# Patient Record
Sex: Female | Born: 1995 | Race: Black or African American | Hispanic: No | Marital: Single | Smoking: Former smoker
Health system: Southern US, Community
[De-identification: ages and names within clinical notes are randomized; demographics above are authoritative.]

## PROBLEM LIST (undated history)

## (undated) ENCOUNTER — Ambulatory Visit: Payer: Commercial Managed Care - PPO | Source: Home / Self Care

## (undated) DIAGNOSIS — F419 Anxiety disorder, unspecified: Secondary | ICD-10-CM

## (undated) DIAGNOSIS — H209 Unspecified iridocyclitis: Secondary | ICD-10-CM

## (undated) DIAGNOSIS — J45909 Unspecified asthma, uncomplicated: Secondary | ICD-10-CM

## (undated) DIAGNOSIS — B009 Herpesviral infection, unspecified: Secondary | ICD-10-CM

## (undated) DIAGNOSIS — F32A Depression, unspecified: Secondary | ICD-10-CM

## (undated) DIAGNOSIS — F329 Major depressive disorder, single episode, unspecified: Secondary | ICD-10-CM

## (undated) DIAGNOSIS — M0800 Unspecified juvenile rheumatoid arthritis of unspecified site: Secondary | ICD-10-CM

## (undated) HISTORY — PX: WISDOM TOOTH EXTRACTION: SHX21

## (undated) HISTORY — DX: Unspecified juvenile rheumatoid arthritis of unspecified site: M08.00

## (undated) HISTORY — DX: Herpesviral infection, unspecified: B00.9

---
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
## 2017-01-07 ENCOUNTER — Emergency Department (HOSPITAL_COMMUNITY)
Admission: EM | Admit: 2017-01-07 | Discharge: 2017-01-08 | Disposition: A | Discharge status: Other Acute Inpatient Hospital | Payer: BLUE CROSS/BLUE SHIELD | Attending: Emergency Medicine | Admitting: Emergency Medicine

## 2017-01-07 ENCOUNTER — Encounter (HOSPITAL_COMMUNITY): Payer: Self-pay | Admitting: Family Medicine

## 2017-01-07 DIAGNOSIS — F22 Delusional disorders: Secondary | ICD-10-CM

## 2017-01-07 DIAGNOSIS — R638 Other symptoms and signs concerning food and fluid intake: Secondary | ICD-10-CM | POA: Insufficient documentation

## 2017-01-07 DIAGNOSIS — Z046 Encounter for general psychiatric examination, requested by authority: Secondary | ICD-10-CM | POA: Diagnosis not present

## 2017-01-07 DIAGNOSIS — F29 Unspecified psychosis not due to a substance or known physiological condition: Secondary | ICD-10-CM | POA: Diagnosis not present

## 2017-01-07 DIAGNOSIS — F333 Major depressive disorder, recurrent, severe with psychotic symptoms: Secondary | ICD-10-CM | POA: Diagnosis present

## 2017-01-07 DIAGNOSIS — R440 Auditory hallucinations: Secondary | ICD-10-CM | POA: Diagnosis not present

## 2017-01-07 DIAGNOSIS — R460 Very low level of personal hygiene: Secondary | ICD-10-CM | POA: Diagnosis not present

## 2017-01-07 DIAGNOSIS — Z79899 Other long term (current) drug therapy: Secondary | ICD-10-CM | POA: Diagnosis not present

## 2017-01-07 DIAGNOSIS — Z87891 Personal history of nicotine dependence: Secondary | ICD-10-CM | POA: Diagnosis not present

## 2017-01-07 DIAGNOSIS — R45851 Suicidal ideations: Secondary | ICD-10-CM | POA: Diagnosis not present

## 2017-01-07 DIAGNOSIS — F1220 Cannabis dependence, uncomplicated: Secondary | ICD-10-CM | POA: Diagnosis not present

## 2017-01-07 DIAGNOSIS — F329 Major depressive disorder, single episode, unspecified: Secondary | ICD-10-CM | POA: Diagnosis present

## 2017-01-07 DIAGNOSIS — F332 Major depressive disorder, recurrent severe without psychotic features: Secondary | ICD-10-CM | POA: Diagnosis present

## 2017-01-07 HISTORY — DX: Depression, unspecified: F32.A

## 2017-01-07 HISTORY — DX: Anxiety disorder, unspecified: F41.9

## 2017-01-07 HISTORY — DX: Major depressive disorder, single episode, unspecified: F32.9

## 2017-01-07 LAB — CBC
HEMATOCRIT: 35.7 % — AB (ref 36.0–46.0)
Hemoglobin: 11.8 g/dL — ABNORMAL LOW (ref 12.0–15.0)
MCH: 27.6 pg (ref 26.0–34.0)
MCHC: 33.1 g/dL (ref 30.0–36.0)
MCV: 83.6 fL (ref 78.0–100.0)
Platelets: 236 10*3/uL (ref 150–400)
RBC: 4.27 MIL/uL (ref 3.87–5.11)
RDW: 13.3 % (ref 11.5–15.5)
WBC: 5.7 10*3/uL (ref 4.0–10.5)

## 2017-01-07 LAB — RAPID URINE DRUG SCREEN, HOSP PERFORMED
AMPHETAMINES: NOT DETECTED
BENZODIAZEPINES: NOT DETECTED
Barbiturates: NOT DETECTED
Cocaine: NOT DETECTED
Opiates: NOT DETECTED
Tetrahydrocannabinol: POSITIVE — AB

## 2017-01-07 LAB — COMPREHENSIVE METABOLIC PANEL
ALT: 22 U/L (ref 14–54)
AST: 23 U/L (ref 15–41)
Albumin: 4.2 g/dL (ref 3.5–5.0)
Alkaline Phosphatase: 49 U/L (ref 38–126)
Anion gap: 7 (ref 5–15)
BUN: 11 mg/dL (ref 6–20)
CO2: 24 mmol/L (ref 22–32)
CREATININE: 0.67 mg/dL (ref 0.44–1.00)
Calcium: 9 mg/dL (ref 8.9–10.3)
Chloride: 107 mmol/L (ref 101–111)
GFR calc non Af Amer: >60 mL/min (ref >60)
Glucose, Bld: 103 mg/dL — ABNORMAL HIGH (ref 65–99)
POTASSIUM: 3 mmol/L — AB (ref 3.5–5.1)
SODIUM: 138 mmol/L (ref 135–145)
Total Bilirubin: 0.6 mg/dL (ref 0.3–1.2)
Total Protein: 7.1 g/dL (ref 6.5–8.1)

## 2017-01-07 LAB — ACETAMINOPHEN LEVEL

## 2017-01-07 LAB — POC URINE PREG, ED: PREG TEST UR: NEGATIVE

## 2017-01-07 LAB — ETHANOL: Alcohol, Ethyl (B): <10 mg/dL (ref <10)

## 2017-01-07 LAB — SALICYLATE LEVEL

## 2017-01-07 MED ORDER — POTASSIUM CHLORIDE CRYS ER 20 MEQ PO TBCR
60.0000 meq | EXTENDED_RELEASE_TABLET | Freq: Once | ORAL | Status: AC
  Administered 2017-01-07: 60 meq via ORAL
  Filled 2017-01-07: qty 3

## 2017-01-07 NOTE — ED Notes (Addendum)
Patient didn't take all her Potassium, only about half of it. She reports the pills were too big despite breaking them in half. Will notify the PA.
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
## 2017-01-07 NOTE — ED Triage Notes (Signed)
Patient was picked up from home and brought in by [facility] as an IVC. Patient denies being suicidal or homicidal. However, patient does admit to being paranoid, "about everything". IVC papers report patient is not eating, tending to her personal hygiene, made a remark to father about ending up dead, and provides examples about being paranoid. Also reports she has been smoking weed, which patient does admit to smoking 1-2 times a day.

## 2017-01-07 NOTE — ED Notes (Signed)
IVC papers have been faxed to Floydene Flock counsler at [facility]. TTS consult being performed.
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
## 2017-01-08 ENCOUNTER — Other Ambulatory Visit: Payer: Self-pay

## 2017-01-08 ENCOUNTER — Inpatient Hospital Stay (HOSPITAL_COMMUNITY)
Admission: AD | Admit: 2017-01-08 | Discharge: 2017-01-12 | DRG: 885 | Disposition: A | Discharge status: Home / Self Care | Payer: BLUE CROSS/BLUE SHIELD | Source: Intra-hospital | Attending: Psychiatry | Admitting: Psychiatry

## 2017-01-08 ENCOUNTER — Encounter (HOSPITAL_COMMUNITY): Payer: Self-pay | Admitting: *Deleted

## 2017-01-08 DIAGNOSIS — Z62810 Personal history of physical and sexual abuse in childhood: Secondary | ICD-10-CM | POA: Diagnosis not present

## 2017-01-08 DIAGNOSIS — Z9119 Patient's noncompliance with other medical treatment and regimen: Secondary | ICD-10-CM | POA: Diagnosis not present

## 2017-01-08 DIAGNOSIS — M069 Rheumatoid arthritis, unspecified: Secondary | ICD-10-CM | POA: Diagnosis present

## 2017-01-08 DIAGNOSIS — Z888 Allergy status to other drugs, medicaments and biological substances status: Secondary | ICD-10-CM | POA: Diagnosis not present

## 2017-01-08 DIAGNOSIS — F39 Unspecified mood [affective] disorder: Secondary | ICD-10-CM | POA: Diagnosis not present

## 2017-01-08 DIAGNOSIS — Z23 Encounter for immunization: Secondary | ICD-10-CM

## 2017-01-08 DIAGNOSIS — F333 Major depressive disorder, recurrent, severe with psychotic symptoms: Secondary | ICD-10-CM

## 2017-01-08 DIAGNOSIS — Z91013 Allergy to seafood: Secondary | ICD-10-CM

## 2017-01-08 DIAGNOSIS — R450 Nervousness: Secondary | ICD-10-CM | POA: Diagnosis not present

## 2017-01-08 DIAGNOSIS — F29 Unspecified psychosis not due to a substance or known physiological condition: Secondary | ICD-10-CM

## 2017-01-08 DIAGNOSIS — Z87891 Personal history of nicotine dependence: Secondary | ICD-10-CM

## 2017-01-08 DIAGNOSIS — X58XXXA Exposure to other specified factors, initial encounter: Secondary | ICD-10-CM | POA: Diagnosis present

## 2017-01-08 DIAGNOSIS — F329 Major depressive disorder, single episode, unspecified: Secondary | ICD-10-CM | POA: Diagnosis present

## 2017-01-08 DIAGNOSIS — F4310 Post-traumatic stress disorder, unspecified: Secondary | ICD-10-CM | POA: Diagnosis not present

## 2017-01-08 DIAGNOSIS — Z91018 Allergy to other foods: Secondary | ICD-10-CM

## 2017-01-08 DIAGNOSIS — G4700 Insomnia, unspecified: Secondary | ICD-10-CM | POA: Diagnosis present

## 2017-01-08 DIAGNOSIS — F1220 Cannabis dependence, uncomplicated: Secondary | ICD-10-CM

## 2017-01-08 DIAGNOSIS — T43216A Underdosing of selective serotonin and norepinephrine reuptake inhibitors, initial encounter: Secondary | ICD-10-CM | POA: Diagnosis present

## 2017-01-08 DIAGNOSIS — Z91128 Patient's intentional underdosing of medication regimen for other reason: Secondary | ICD-10-CM | POA: Diagnosis not present

## 2017-01-08 DIAGNOSIS — Z79899 Other long term (current) drug therapy: Secondary | ICD-10-CM | POA: Diagnosis not present

## 2017-01-08 DIAGNOSIS — F419 Anxiety disorder, unspecified: Secondary | ICD-10-CM | POA: Diagnosis present

## 2017-01-08 DIAGNOSIS — R443 Hallucinations, unspecified: Secondary | ICD-10-CM | POA: Diagnosis not present

## 2017-01-08 DIAGNOSIS — F332 Major depressive disorder, recurrent severe without psychotic features: Secondary | ICD-10-CM | POA: Diagnosis present

## 2017-01-08 MED ORDER — CITALOPRAM HYDROBROMIDE 10 MG PO TABS
10.0000 mg | ORAL_TABLET | Freq: Every day | ORAL | Status: DC
  Filled 2017-01-08: qty 1

## 2017-01-08 MED ORDER — ALUM & MAG HYDROXIDE-SIMETH 200-200-20 MG/5ML PO SUSP: 30.0000 mL | ORAL | Status: DC | PRN

## 2017-01-08 MED ORDER — ACETAMINOPHEN 325 MG PO TABS: 650.0000 mg | ORAL_TABLET | Freq: Four times a day (QID) | ORAL | Status: DC | PRN

## 2017-01-08 MED ORDER — INFLUENZA VAC SPLIT QUAD 0.5 ML IM SUSY
0.5000 mL | PREFILLED_SYRINGE | INTRAMUSCULAR | Status: AC
  Administered 2017-01-09: 0.5 mL via INTRAMUSCULAR
  Filled 2017-01-08: qty 0.5

## 2017-01-08 MED ORDER — MAGNESIUM HYDROXIDE 400 MG/5ML PO SUSP: 30.0000 mL | Freq: Every day | ORAL | Status: DC | PRN

## 2017-01-08 MED ORDER — ONDANSETRON HCL 4 MG PO TABS: 4.0000 mg | ORAL_TABLET | Freq: Three times a day (TID) | ORAL | Status: DC | PRN

## 2017-01-08 MED ORDER — ACETAMINOPHEN 325 MG PO TABS: 650.0000 mg | ORAL_TABLET | ORAL | Status: DC | PRN

## 2017-01-08 MED ORDER — HALOPERIDOL 1 MG PO TABS
0.5000 mg | ORAL_TABLET | Freq: Two times a day (BID) | ORAL | Status: DC
  Filled 2017-01-08: qty 1

## 2017-01-08 MED ORDER — ONDANSETRON HCL 4 MG PO TABS
4.0000 mg | ORAL_TABLET | Freq: Three times a day (TID) | ORAL | Status: DC | PRN
Start: 2017-01-08 — End: 2017-01-12

## 2017-01-08 MED ORDER — HALOPERIDOL 0.5 MG PO TABS
0.5000 mg | ORAL_TABLET | Freq: Two times a day (BID) | ORAL | Status: DC
  Administered 2017-01-09: 0.5 mg via ORAL
  Filled 2017-01-08 (×7): qty 1

## 2017-01-08 MED ORDER — POTASSIUM CHLORIDE 20 MEQ/15ML (10%) PO SOLN
30.0000 meq | Freq: Once | ORAL | Status: AC
  Administered 2017-01-08: 30 meq via ORAL
  Filled 2017-01-08: qty 30

## 2017-01-08 MED ORDER — HYDROXYZINE HCL 25 MG PO TABS
25.0000 mg | ORAL_TABLET | Freq: Two times a day (BID) | ORAL | Status: DC
  Administered 2017-01-09: 25 mg via ORAL
  Filled 2017-01-08 (×7): qty 1

## 2017-01-08 MED ORDER — HYDROXYZINE HCL 25 MG PO TABS
25.0000 mg | ORAL_TABLET | Freq: Two times a day (BID) | ORAL | Status: DC
  Filled 2017-01-08: qty 1

## 2017-01-08 MED ORDER — CITALOPRAM HYDROBROMIDE 10 MG PO TABS
10.0000 mg | ORAL_TABLET | Freq: Every day | ORAL | Status: DC
  Administered 2017-01-09 – 2017-01-12 (×4): 10 mg via ORAL
  Filled 2017-01-08 (×4): qty 1
  Filled 2017-01-08: qty 7
  Filled 2017-01-08 (×4): qty 1

## 2017-01-08 NOTE — Progress Notes (Signed)
Kayla Ochoa is a 21 year old female pt admitted on involuntary basis. On admission, she endorses depression but denies any SI and is able to contract for safety while in the hospital. She did appear watchful and suspicious and did report feeling paranoid. She spoke about how she was on medications but has not taken them in a few months and reports that she does not want to take any while she is here. She does endorse frequent marijuana usage and reports that she usually smokes it 2 times a day and reports that she used to smoke it up to 8 times daily. She denies any other substance abuse. She reports that she lives with her mother and reports that she will be able to go back there after discharge. She was oriented to the unit and safety maintained.

## 2017-01-08 NOTE — ED Notes (Signed)
Pt A&O x 3, no distress noted, pt sleeping at present.  Monitoring for safety, Q 15 min checks in effect.

## 2017-01-08 NOTE — ED Notes (Signed)
Report attempted x 1, unit states RN Brook to return call.

## 2017-01-08 NOTE — ED Notes (Signed)
Report to RN [place], [facility].  Pending [employer] transport.

## 2017-01-08 NOTE — ED Notes (Signed)
[employer] transport requested. 

## 2017-01-08 NOTE — Tx Team (Signed)
Initial Treatment Plan 01/08/2017 10:11 PM Kayla Ochoa PYP:[id number]    PATIENT STRESSORS: Marital or family conflict Medication change or noncompliance Substance abuse   PATIENT STRENGTHS: Ability for insight Average or above average intelligence Communication skills General fund of knowledge   PATIENT IDENTIFIED PROBLEMS: Depression Paranoia Suicidal thoughts "I just want to get my life straight" "Family and friends are stressors and dealing with it all"                     DISCHARGE CRITERIA:  Ability to meet basic life and health needs Improved stabilization in mood, thinking, and/or behavior Verbal commitment to aftercare and medication compliance  PRELIMINARY DISCHARGE PLAN: Attend aftercare/continuing care group Return to previous living arrangement  PATIENT/FAMILY INVOLVEMENT: This treatment plan has been presented to and reviewed with the patient, Kayla Ochoa, and/or family member, .  The patient and family have been given the opportunity to ask questions and make suggestions.  Darron Stuck, [place], [place] 01/08/2017, 10:11 PM

## 2017-01-08 NOTE — ED Notes (Signed)
Patients care transferred to [place], RN in [place] for room 34. Patient was wanded by security and she ambulated to [facility] 34 with a steady gait. Patients belongings and IVC papers sent to SAPU.

## 2017-01-08 NOTE — BH Assessment (Addendum)
[facility] Assessment Progress Note  Per Thedore Mins, MD, this pt requires psychiatric hospitalization.  Malva Limes, RN, [facility] has assigned pt to [facility] Rm [id number]; they will be ready to receive pt at 16:00.  Pt presents under IVC initiated by pt's mother, and upheld by Dr Jannifer Franklin, and IVC documents have been faxed to [facility].  Pt's nurse, Kendal Hymen, has been notified, and agrees to call report to [phone].  Pt is to be transported via [employer].   Doylene Canning, MA Triage Specialist [phone]   Addendum:  Richelle Ito calls back.  Transfer time has been moved back to 19:30.  Kendal Hymen has been notified.  Doylene Canning, MA Triage Specialist [phone]

## 2017-01-08 NOTE — Progress Notes (Signed)
Pt places as do not admit due to paranoia/watchfulness/suspiciousness. Pt sts, "I know they are coming for me. I don't know who, but they are." Will continue to monitor.

## 2017-01-08 NOTE — Progress Notes (Signed)
01/08/17 1409:  LRT introduced self to pt and offered activities.  Pt stated she was interested because it would give her something to do.  LRT and pt played checkers in the dayroom. Pt was pleasant and engaged throughout activity.  LRT asked pt if she wanted any word searches for later, pt stated she did.  LRT copied word searches for pt so she would have them later.   Caroll Rancher, LRT/CTRS

## 2017-01-08 NOTE — ED Notes (Signed)
Pt is a bit calmer and even interacted with other patient and recreation therapy earlier.  Patient is in no distress and resting quietly.
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
## 2017-01-08 NOTE — ED Notes (Signed)
Pt is very irritable after finding out that she will not be released.  Pt is denying S/I, H/I, and AVH.  Pt is refusing her medications.  15 minute checks and video monitoring continue.

## 2017-01-08 NOTE — ED Notes (Signed)
Pt presents with paranoia, denies SI, HI or AVH.  A&O x 3. Calm & cooperative, no distress noted.  Monitoring for safety, Q 15 min checks in effect.

## 2017-01-09 DIAGNOSIS — F419 Anxiety disorder, unspecified: Secondary | ICD-10-CM

## 2017-01-09 DIAGNOSIS — F333 Major depressive disorder, recurrent, severe with psychotic symptoms: Principal | ICD-10-CM

## 2017-01-09 DIAGNOSIS — F1220 Cannabis dependence, uncomplicated: Secondary | ICD-10-CM

## 2017-01-09 DIAGNOSIS — R443 Hallucinations, unspecified: Secondary | ICD-10-CM

## 2017-01-09 DIAGNOSIS — Z62810 Personal history of physical and sexual abuse in childhood: Secondary | ICD-10-CM

## 2017-01-09 DIAGNOSIS — F4310 Post-traumatic stress disorder, unspecified: Secondary | ICD-10-CM

## 2017-01-09 DIAGNOSIS — R450 Nervousness: Secondary | ICD-10-CM

## 2017-01-09 MED ORDER — RISPERIDONE 0.5 MG PO TABS
0.5000 mg | ORAL_TABLET | Freq: Two times a day (BID) | ORAL | Status: DC
  Administered 2017-01-09 – 2017-01-10 (×3): 0.5 mg via ORAL
  Filled 2017-01-09 (×6): qty 1

## 2017-01-09 MED ORDER — HYDROXYZINE HCL 25 MG PO TABS
25.0000 mg | ORAL_TABLET | Freq: Four times a day (QID) | ORAL | Status: DC | PRN
  Administered 2017-01-10 – 2017-01-11 (×2): 25 mg via ORAL
  Filled 2017-01-09: qty 12
  Filled 2017-01-09 (×2): qty 1
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
## 2017-01-09 NOTE — [facility] Suicide Risk Assessment (Signed)
[facility] INPATIENT:  Family/Significant Other Suicide Prevention Education  Suicide Prevention Education:  Patient Refusal for Family/Significant Other Suicide Prevention Education: The patient Kayla Ochoa has refused to provide written consent for family/significant other to be provided Family/Significant Other Suicide Prevention Education during admission and/or prior to discharge.  Physician notified.  Metro Kung Buffey Zabinski 01/09/2017, 9:26 AM

## 2017-01-09 NOTE — [facility] Group Notes (Signed)
[facility] LCSW Group Therapy  01/09/2017  1:05 PM  Type of Therapy:  Group therapy  Participation Level:  Active  Participation Quality:  Attentive  Affect:  Flat  Cognitive:  Oriented  Insight:  Limited  Engagement in Therapy:  Limited  Modes of Intervention:  Discussion, Socialization  Summary of Progress/Problems:  Chaplain was here to lead a group on themes of hope and courage. "hope is family.  My mom is my role model.  She is the most important person in my life." Later cited her Letta Kocher as an influential person.  "He has a lot of wisdom to share that has helped me if I chose to listen to it."  Stayed the entire time, engaged throughout.  Good mood, no signs of psychosis/paranoia. Daryel Gerald B 01/09/2017 1:27 PM

## 2017-01-09 NOTE — Plan of Care (Signed)
Pt safe on the unit at this time 

## 2017-01-09 NOTE — [facility] Suicide Risk Assessment (Signed)
[facility] Admission Suicide Risk Assessment   Nursing information obtained from:    Demographic factors:    Current Mental Status:    Loss Factors:    Historical Factors:    Risk Reduction Factors:     Total Time spent with patient: 1 hour Principal Problem: Major depressive disorder, recurrent, severe with psychotic features (HCC) Diagnosis:   Patient Active Problem List   Diagnosis Date Noted  . Cannabis use disorder, severe, dependence (HCC) [F12.20] 01/08/2017  . Major depressive disorder, recurrent episode, severe, with psychosis (HCC) [F33.3] 01/08/2017  . Major depressive disorder, recurrent, severe with psychotic features (HCC) [F33.3] 01/08/2017   Subjective Data:  See H&P for full HPI Kayla Ochoa is a 21 y/o F with preferred name of "JJ" and history of treatment for depression and cannabis use disorder who was admitted with worsening symptoms of paranoia and psychosis. Pt was placed on IVC by police after her parents called to report pt was agitated and paranoid, and she had not been meeting her ADL's of hygiene and eating food. Pt endorses paranoia that she is being followed and others are watching her and making alterations to her phone and other possessions. She endorses AH and VH. She endorses SI without plan. She has been using cannabis about three times per day. She agrees to be started on risperdal 0.5mg  BID, celexa 10mg  qDay, and atarax 25mg  q6h prn anxiety.   Continued Clinical Symptoms:  Alcohol Use Disorder Identification Test Final Score (AUDIT): 0 The "Alcohol Use Disorders Identification Test", Guidelines for Use in Primary Care, Second Edition.  [facility]). Score between 0-7:  no or low risk or alcohol related problems. Score between 8-15:  moderate risk of alcohol related problems. Score between 16-19:  high risk of alcohol related problems. Score 20 or above:  warrants further diagnostic evaluation for alcohol dependence and  treatment.   CLINICAL FACTORS:   Severe Anxiety and/or Agitation Depression:   Comorbid alcohol abuse/dependence Alcohol/Substance Abuse/Dependencies More than one psychiatric diagnosis Currently Psychotic Unstable or Poor Therapeutic Relationship Previous Psychiatric Diagnoses and Treatments Medical Diagnoses and Treatments/Surgeries   Musculoskeletal: Strength & Muscle Tone: within normal limits Gait & Station: normal Patient leans: N/A  Psychiatric Specialty Exam: Physical Exam  Nursing note and vitals reviewed.   Review of Systems  Constitutional: Negative for chills and fever.  Respiratory: Negative for cough and shortness of breath.   Cardiovascular: Negative for chest pain.  Gastrointestinal: Negative for abdominal pain, heartburn, nausea and vomiting.  Psychiatric/Behavioral: Positive for depression, hallucinations, substance abuse and suicidal ideas. The patient is nervous/anxious.     Blood pressure (!) 113/59, pulse 71, temperature 98.7 F (37.1 C), temperature source Oral, resp. rate 16, height 5\' 2"  (1.575 m), weight 39.5 kg (87 lb).Body mass index is 15.91 kg/m.  General Appearance: Casual and Fairly Groomed  Eye Contact:  Good  Speech:  Clear and Coherent and Normal Rate  Volume:  Normal  Mood:  Anxious  Affect:  Congruent and Constricted  Thought Process:  Coherent, Goal Directed and Descriptions of Associations: Loose  Orientation:  Full (Time, Place, and Person)  Thought Content:  Illogical, Delusions, Hallucinations: Auditory Visual, Ideas of Reference:   Paranoia and Paranoid Ideation  Suicidal Thoughts:  Yes.  without intent/plan  Homicidal Thoughts:  No  Memory:  Immediate;   Good Recent;   Good Remote;   Good  Judgement:  Impaired  Insight:  Lacking  Psychomotor Activity:  Normal  Concentration:  Concentration: Good  Recall:  Fair  [employer] of Knowledge:  Fair  Language:  Fair  Akathisia:  No  Handed:    AIMS (if indicated):     Assets:   Communication Skills Leisure Time Physical Health Resilience Social Support  ADL's:  Intact  Cognition:  WNL  Sleep:  Number of Hours: 6.75       COGNITIVE FEATURES THAT CONTRIBUTE TO RISK:  Closed-mindedness and Thought constriction (tunnel vision)    SUICIDE RISK:   Mild:  Suicidal ideation of limited frequency, intensity, duration, and specificity.  There are no identifiable plans, no associated intent, mild dysphoria and related symptoms, good self-control (both objective and subjective assessment), few other risk factors, and identifiable protective factors, including available and accessible social support.  PLAN OF CARE:  - Admit to inpatient psychiatry unit - Labs: prolactin, TSH, lipid panel, hgbA1c - ECG for baseline QTc monitoring - Psychosis unspecified vs MDD with psychotic features  - Start risperdal 0.5mg  BID  - Continue celexa 10mg  qDay - Anxiety  - Start atarax 25mg  q6h prn anxiety - Cannabis use disodrer  - SW team to discuss with patient about potential treatment options - Encourage participation in groups and the therapeutic milieu - Discharge planning will be ongoing  I certify that inpatient services furnished can reasonably be expected to improve the patient's condition.   , MD 01/09/2017, 4:09 PM

## 2017-01-09 NOTE — Progress Notes (Signed)
Recreation Therapy Notes  INPATIENT RECREATION THERAPY ASSESSMENT  Patient Details Name: Kayla Ochoa MRN: [id number] DOB: [birth year] Today's Date: 01/09/2017  Patient Stressors: Other (Comment)(People asking questions; voices)  Pt stated she was here for paranoia.  Coping Skills:   Isolate, Substance Abuse, Avoidance, Art/Dance, Music  Personal Challenges: Anger, Communication, Concentration, Decision-Making, Expressing Yourself, Self-Esteem/Confidence, Social Interaction, Stress Management, Time Management, Trusting Others  Leisure Interests (2+):  Games - Video games, Individual - Reading  Awareness of Community Resources:  Yes  Community Resources:  Park, Warehouse manager, Other (Comment)(Stores)  Current Use: No  If no, Barriers?: Other (Comment)(want to be home)  Patient Strengths:  Avoiding; keeping calm  Patient Identified Areas of Improvement:  Just want to be alone  Current Recreation Participation:  "It's not fun anymore"  Patient Goal for Hospitalization:  "Work on myself"  [place] of Residence:  [place] of Residence:  [place]  Current [place] (including self-harm):  No  Current HI:  No  Consent to Intern Participation: N/A   Caroll Rancher, LRT/CTRS  Caroll Rancher A 01/09/2017, 2:54 PM

## 2017-01-09 NOTE — Progress Notes (Signed)
D: Pt stayed in her room majority of the evening, pt kept to herself in her room.   A: Pt was offered support and encouragement. Pt was given scheduled medications. Pt was encourage to attend groups. Q 15 minute checks were done for safety.  R:and safety maintained on unit.
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
## 2017-01-09 NOTE — Progress Notes (Signed)
DAR NOTE: Patient presents with anxious affect and depressed mood.  Denies pain, auditory and visual hallucinations.  Reports tremors, chilling, runny nose, nausea and irritability on self inventory form.  Rates depression at 5, hopelessness at 5, and anxiety at 5.  Maintained on routine safety checks.  Medications given as prescribed.  Support and encouragement offered as needed.  Attended group and participated.  States goal for today is "to let my guard down and not worry about other people."  Patient observed socializing with peers in the dayroom.  Offered no complaint.

## 2017-01-09 NOTE — Progress Notes (Signed)
Recreation Therapy Notes  Date: 01/09/17 Time: 1000 Location: [address] Dayroom   Group Topic: Communication, Team Building, Problem Solving  Goal Area(s) Addresses:  Patient will effectively work with peer towards shared goal.  Patient will identify skills used to make activity successful.  Patient will identify how skills used during activity can be used to reach post d/c goals.   Intervention: STEM Activity  Activity: Stage manager. In teams patients were given 12 plastic drinking straws and a length of masking tape. Using the materials provided patients were asked to build a landing pad to catch a golf ball dropped from approximately 6 feet in the air.   Education: Pharmacist, community, Discharge Planning   Education Outcome: Acknowledges education/In group clarification offered/Needs additional education.   Clinical Observations/Feedback: Pt did not attend group.     Caroll Rancher, LRT/CTRS         Caroll Rancher A 01/09/2017 11:34 AM

## 2017-01-09 NOTE — Tx Team (Signed)
Interdisciplinary Treatment and Diagnostic Plan Update  01/09/2017 Time of Session: 9:26 AM  Kayla Ochoa MRN: [id number]  Principal Diagnosis:  Major depressive disorder, recurrent, severe with psychotic features  Secondary Diagnoses: Active Problems:   Major depressive disorder, recurrent, severe with psychotic features (HCC)   Current Medications:  Current Facility-Administered Medications  Medication Dose Route Frequency Provider Last Rate Last Dose  . acetaminophen (TYLENOL) tablet 650 mg  650 mg Oral Q4H PRN Charm Rings, NP      . alum & mag hydroxide-simeth (MAALOX/MYLANTA) 200-200-20 MG/5ML suspension 30 mL  30 mL Oral Q4H PRN Charm Rings, NP      . citalopram (CELEXA) tablet 10 mg  10 mg Oral Daily Charm Rings, NP   10 mg at 01/09/17 0759  . haloperidol (HALDOL) tablet 0.5 mg  0.5 mg Oral BID Charm Rings, NP   0.5 mg at 01/09/17 0759  . hydrOXYzine (ATARAX/VISTARIL) tablet 25 mg  25 mg Oral BID Charm Rings, NP   25 mg at 01/09/17 0759  . Influenza vac split quadrivalent PF (FLUARIX) injection 0.5 mL  0.5 mL Intramuscular Tomorrow-1000 [place], Christopher T, MD      . magnesium hydroxide (MILK OF MAGNESIA) suspension 30 mL  30 mL Oral Daily PRN Charm Rings, NP      . ondansetron [facility]) tablet 4 mg  4 mg Oral Q8H PRN Charm Rings, NP        PTA Medications: Medications Prior to Admission  Medication Sig Dispense Refill Last Dose  . albuterol (PROVENTIL HFA;VENTOLIN HFA) 108 (90 Base) MCG/ACT inhaler Inhale 2 puffs into the lungs every 4 (four) hours as needed for wheezing or shortness of breath. (Patient not taking: Reported on 05/07/2016) 1 Inhaler 0 Not Taking at Unknown time  . dolutegravir (TIVICAY) 50 MG tablet Take 1 tablet (50 mg total) by mouth daily. (Patient not taking: Reported on 05/07/2016) 28 tablet 0 Not Taking at Unknown time  . emtricitabine-tenofovir AF (DESCOVY) 200-25 MG tablet Take 1 tablet by mouth daily. (Patient not taking:  Reported on 05/07/2016) 28 tablet 0 Not Taking at Unknown time  . ibuprofen (ADVIL,MOTRIN) 800 MG tablet Take 1 tablet (800 mg total) by mouth every 8 (eight) hours as needed. (Patient not taking: Reported on 01/07/2017) 21 tablet 0 Completed Course at Unknown time  . venlafaxine XR (EFFEXOR-XR) 75 MG 24 hr capsule Take 75 mg by mouth daily.   June 2018 at unknown time    Treatment Modalities: Medication Management, Group therapy, Case management,  1 to 1 session with clinician, Psychoeducation, Recreational therapy.  Patient Stressors: Marital or family conflict Medication change or noncompliance Substance abuse  Patient Strengths: Ability for insight Average or above average intelligence Wellsite geologist fund of knowledge   Physician Treatment Plan for Primary Diagnosis:  Major depressive disorder, recurrent, severe with psychotic features Long Term Goal(s): Improvement in symptoms so as ready for discharge  Short Term Goals:    Medication Management: Evaluate patient's response, side effects, and tolerance of medication regimen.  Therapeutic Interventions: 1 to 1 sessions, Unit Group sessions and Medication administration.  Evaluation of Outcomes: Progressing  Physician Treatment Plan for Secondary Diagnosis: Active Problems:   Major depressive disorder, recurrent, severe with psychotic features (HCC)  Long Term Goal(s): Improvement in symptoms so as ready for discharge  Short Term Goals:    Medication Management: Evaluate patient's response, side effects, and tolerance of medication regimen.  Therapeutic Interventions: 1 to 1 sessions, Unit  Group sessions and Medication administration.  Evaluation of Outcomes: Progressing   RN Treatment Plan for Primary Diagnosis:  Major depressive disorder, recurrent, severe with psychotic features Long Term Goal(s): Knowledge of disease and therapeutic regimen to maintain health will improve  Short Term Goals: Ability to  remain free from injury will improve, Ability to disclose and discuss suicidal ideas, Ability to identify and develop effective coping behaviors will improve and Compliance with prescribed medications will improve  Medication Management: RN will administer medications as ordered by provider, will assess and evaluate patient's response and provide education to patient for prescribed medication. RN will report any adverse and/or side effects to prescribing provider.  Therapeutic Interventions: 1 on 1 counseling sessions, Psychoeducation, Medication administration, Evaluate responses to treatment, Monitor vital signs and CBGs as ordered, Perform/monitor CIWA, COWS, AIMS and Fall Risk screenings as ordered, Perform wound care treatments as ordered.  Evaluation of Outcomes: Progressing   LCSW Treatment Plan for Primary Diagnosis:  Major depressive disorder, recurrent, severe with psychotic features Long Term Goal(s): Safe transition to appropriate next level of care at discharge, Engage patient in therapeutic group addressing interpersonal concerns.  Short Term Goals: Engage patient in aftercare planning with referrals and resources, Facilitate acceptance of mental health diagnosis and concerns, Facilitate patient progression through stages of change regarding substance use diagnoses and concerns, Identify triggers associated with mental health/substance abuse issues and Increase skills for wellness and recovery  Therapeutic Interventions: Assess for all discharge needs, 1 to 1 time with Social worker, Explore available resources and support systems, Assess for adequacy in community support network, Educate family and significant other(s) on suicide prevention, Complete Psychosocial Assessment, Interpersonal group therapy.  Evaluation of Outcomes: Progressing   Progress in Treatment: Attending groups: Yes Participating in groups: Yes Taking medication as prescribed: Yes Toleration of medication:  Yes, no side effects reported at this time Family/Significant other contact made: No Patient understands diagnosis: No, limited insight  Discussing patient identified problems/goals with staff: Yes Medical problems stabilized or resolved: Yes Denies suicidal/homicidal ideation: Yes Issues/concerns per patient self-inventory: None Other: N/A  New problem(s) identified: None identified at this time.   New Short Term/Long Term Goal(s): "I am not really sure why I am here, but I need an outpatient therapist and a psychiatrist for medication that way if I have a mental break down I can come back here".    Discharge Plan or Barriers: Upon discharge pt will return home with her mother and sister   Reason for Continuation of Hospitalization: Depression Hallucinations Medication stabilization Suicidal ideation   Estimated Length of Stay: 01/14/17  Attendees: Patient: Kayla Ochoa  01/09/2017  9:26 AM  Physician: Jolyne Loa, MD 01/09/2017  9:26 AM  Nursing: Estella Husk, RN 01/09/2017  9:26 AM  RN Care Manager: Onnie Boer, RN 01/09/2017  9:26 AM  Social Worker: Richelle Ito, LCSW; Melba Coon, Social Work Intern 01/09/2017  9:26 AM  Recreational Therapist: Caroll Rancher, LRT 01/09/2017  9:26 AM  Other: Tomasita Morrow, P4CC 01/09/2017  9:26 AM  Other:  01/09/2017  9:26 AM  Other: 01/09/2017  9:26 AM    Scribe for Treatment Team: [name], Student-Social Work 01/09/2017 9:26 AM

## 2017-01-10 DIAGNOSIS — Z87891 Personal history of nicotine dependence: Secondary | ICD-10-CM

## 2017-01-10 DIAGNOSIS — F39 Unspecified mood [affective] disorder: Secondary | ICD-10-CM

## 2017-01-10 MED ORDER — RISPERIDONE 1 MG PO TABS
1.0000 mg | ORAL_TABLET | Freq: Two times a day (BID) | ORAL | Status: DC
  Administered 2017-01-10 – 2017-01-12 (×4): 1 mg via ORAL
  Filled 2017-01-10 (×3): qty 1
  Filled 2017-01-10: qty 14
  Filled 2017-01-10 (×4): qty 1
  Filled 2017-01-10: qty 14

## 2017-01-10 MED ORDER — TRAZODONE HCL 50 MG PO TABS
50.0000 mg | ORAL_TABLET | Freq: Every evening | ORAL | Status: DC | PRN
  Administered 2017-01-10: 50 mg via ORAL
  Filled 2017-01-10 (×7): qty 1

## 2017-01-10 NOTE — [facility] Group Notes (Signed)
LCSW Group Therapy Note  Date/Time:  01/10/2017  11:15AM-12:00PM  Type of Therapy and Topic:  Group Therapy:  Fears and Unhealthy/Healthy Coping Skills  Participation Level:  Active   Description of Group:  The focus of this group was to discuss some of the prevalent fears that patients experience, and to identify the commonalities among group members.  An exercise was used to initiate the discussion, followed by writing on the white board a group-generated list of unhealthy coping and healthy coping techniques to deal with each fear.    Therapeutic Goals: 1. Patient will be able to distinguish between healthy and unhealthy coping skills 2. Patient will identify and describe 3 fears they experience 3. Patient will identify one positive coping strategy for each fear they experience 4. Patient will respond empathetically to peers' statements regarding fears they experience  Summary of Patient Progress:  The patient expressed that her fear is being hurt by others and this leads her to just want to be alone.  She stated she does feel she can confide in her brothers and sisters.  Her affect was blunted and irritable throughout group, but she did participate.  Therapeutic Modalities Cognitive Behavioral Therapy Motivational Interviewing  Ambrose Mantle, LCSW

## 2017-01-10 NOTE — Progress Notes (Signed)
[facility] MD Progress Note  01/10/2017 10:33 AM Kayla Ochoa  MRN:  [id number] Subjective:  Describes sense of irritability about being inpatient on involuntary status. States " I am trying no to feel sad ". Denies medication side effects at this time . Objective : I have discussed case with RN staff and have met with patient. Patient is a 21 year old female, admitted under commitment. IVC papers reporting that patient not eating, neglecting ADLs and hygiene, presenting increasingly  paranoid , making remarks about dying to father. (+) Cannabis Abuse . Patient presents guarded, vaguely irritable, reporting not being happy about being in an inpatient setting. Acknowledges some depression, but states she is making an effort to feel better, and affect currently presents more irritable than constricted . Denies suicidal ideations at this time. Reports she has history of " hearing voices ", but not at this time, and does not appear internally preoccupied at present. She is visible in day room, hallway, but interaction with peers is limited.  Does not endorse medication side effects.  Principal Problem: Major depressive disorder, recurrent, severe with psychotic features ([place]) Diagnosis:   Patient Active Problem List   Diagnosis Date Noted  . Cannabis use disorder, severe, dependence ([place]) [F12.20] 01/08/2017  . Major depressive disorder, recurrent episode, severe, with psychosis ([place]) [F33.3] 01/08/2017  . Major depressive disorder, recurrent, severe with psychotic features ([place]) [F33.3] 01/08/2017   Total Time spent with patient: 20 minutes  Past Medical History:  Past Medical History:  Diagnosis Date  . Anxiety   . Depression   . RA (rheumatoid arthritis) ([place])   . Uveitis     Past Surgical History:  Procedure Laterality Date  . TYMPANOSTOMY TUBE PLACEMENT Bilateral 2001   Family History: History reviewed. No pertinent family history. Social History:  Social History   Substance and  Sexual Activity  Alcohol Use No  . Frequency: Never     Social History   Substance and Sexual Activity  Drug Use Yes  . Types: Marijuana   Comment: 1-2 times a day. Last used: PTA.     Social History   Socioeconomic History  . Marital status: Single    Spouse name: None  . Number of children: None  . Years of education: None  . Highest education level: None  Social Needs  . Financial resource strain: None  . Food insecurity - worry: None  . Food insecurity - inability: None  . Transportation needs - medical: None  . Transportation needs - non-medical: None  Occupational History  . None  Tobacco Use  . Smoking status: Former Smoker    Types: Cigars  . Smokeless tobacco: Never Used  Substance and Sexual Activity  . Alcohol use: No    Frequency: Never  . Drug use: Yes    Types: Marijuana    Comment: 1-2 times a day. Last used: PTA.   Marland Kitchen Sexual activity: None  Other Topics Concern  . None  Social History Narrative  . None   Additional Social History:   Sleep: Good  Appetite:  Fair  Current Medications: Current Facility-Administered Medications  Medication Dose Route Frequency Provider Last Rate Last Dose  . acetaminophen (TYLENOL) tablet 650 mg  650 mg Oral Q4H PRN Patrecia Pour, NP      . alum & mag hydroxide-simeth (MAALOX/MYLANTA) 200-200-20 MG/5ML suspension 30 mL  30 mL Oral Q4H PRN Patrecia Pour, NP      . citalopram (CELEXA) tablet 10 mg  10 mg Oral  Daily Patrecia Pour, NP   10 mg at 01/10/17 0825  . hydrOXYzine (ATARAX/VISTARIL) tablet 25 mg  25 mg Oral Q6H PRN Pennelope Bracken, MD      . magnesium hydroxide (MILK OF MAGNESIA) suspension 30 mL  30 mL Oral Daily PRN Patrecia Pour, NP      . ondansetron [facility]) tablet 4 mg  4 mg Oral Q8H PRN Patrecia Pour, NP      . risperiDONE (RISPERDAL) tablet 0.5 mg  0.5 mg Oral BID Pennelope Bracken, MD   0.5 mg at 01/10/17 0825    Lab Results: No results found for this or any previous visit  (from the past 48 hour(s)).  Blood Alcohol level:  Lab Results  Component Value Date   ETH <10 01/07/2017   ETH <5 84/13/2440    Metabolic Disorder Labs: No results found for: HGBA1C, MPG No results found for: PROLACTIN No results found for: CHOL, TRIG, HDL, CHOLHDL, VLDL, LDLCALC  Physical Findings: AIMS: Facial and Oral Movements Muscles of Facial Expression: None, normal Lips and Perioral Area: None, normal Jaw: None, normal Tongue: None, normal,Extremity Movements Upper (arms, wrists, hands, fingers): None, normal Lower (legs, knees, ankles, toes): None, normal, Trunk Movements Neck, shoulders, hips: None, normal, Overall Severity Severity of abnormal movements (highest score from questions above): None, normal Incapacitation due to abnormal movements: None, normal Patient's awareness of abnormal movements (rate only patient's report): No Awareness, Dental Status Current problems with teeth and/or dentures?: No Does patient usually wear dentures?: No  CIWA:    COWS:     Musculoskeletal: Strength & Muscle Tone: within normal limits Gait & Station: normal Patient leans: N/A  Psychiatric Specialty Exam: Physical Exam  ROS denies chest pain , no shortness of breath, no vomiting   Blood pressure (!) 112/92, pulse (!) 127, temperature 98 F (36.7 C), resp. rate 16, height 5' 2"  (1.575 m), weight 39.5 kg (87 lb).Body mass index is 15.91 kg/m.  General Appearance: Fairly Groomed  Eye Contact:  Fair  Speech:  Normal Rate  Volume:  Normal  Mood:  reports she is making "effort" to feel better   Affect:  vaguely constricted, irritable  Thought Process:  Linear and Descriptions of Associations: Intact- generally linear and well organized   Orientation:  Other:  fully alert and attentive   Thought Content:  denies hallucinations at this time, no delusions are expressed, and does not appear internally preoccupied   Suicidal Thoughts:  No today denies suicidal or self injurious  plans or intention, also denies any homicidal ideations   Homicidal Thoughts:  No  Memory:  recent and remote grossly intact   Judgement:  Fair  Insight:  Fair  Psychomotor Activity:  Normal  Concentration:  Concentration: Good and Attention Span: Good  Recall:  Good  Fund of Knowledge:  Good  Language:  Good  Akathisia:  Negative  Handed:  Right  AIMS (if indicated):     Assets:  Desire for Improvement Resilience  ADL's:  Intact  Cognition:  WNL  Sleep:  Number of Hours: 6.75   Assessment- 21 year old female, admitted under commitment by family due to worsening ADLs, self neglect, poor oral intake , paranoia. At this time patient denies any suicidal ideations, denies hallucinations,endorses some depression but states she is making efforts to be more positive . Currently on low dose Celexa and Risperidone   Treatment Plan Summary: Daily contact with patient to assess and evaluate symptoms and progress in treatment,  Medication management, Plan inpatient treatment  and medications as below Encourage group and milieu participation to work on coping skills and symptom reduction Encourage efforts to work on sobriety and abstinence from cannabis  Increase Risperidone to 1 mgrs BID for psychosis, mood  Continue Celexa 10 mgrs QDAY for depression, anxiety  Labs pending  Treatment team working on disposition planning options   Jenne Campus, MD 01/10/2017, 10:33 AM

## 2017-01-10 NOTE — Plan of Care (Signed)
Pt taking medications on the unit this evening

## 2017-01-10 NOTE — Progress Notes (Signed)
D: Pt denies SI/HI/AVH, pt stated she felt she does not have support fromher mom when pt wants to do something. Pt stated she wanted to go to college and do better for herself.   A: Pt was offered support and encouragement. Pt was given scheduled medications. Pt was encourage to attend groups. Q 15 minute checks were done for safety.   R:Pt attends groups and interacts well with peers and staff. Pt is taking medication. Pt has no complaints.Pt receptive to treatment and safety maintained on unit.

## 2017-01-10 NOTE — Plan of Care (Signed)
D: Pt remains safe on the unit. She has been calm, cooperative, and appropriate with staff and peers. She's been present in the dayroom and has participated in unit activities. She denied SI, HI, and AVH. She verbalized no issues, concerns, or questions when given the opportunity to do so.   A: Meds given as ordered. Q15 safety checks maintained. Support/encouragement offered.  R: Pt remains free from harm and continues with treatment. Will continue to monitor for needs/safety.

## 2017-01-10 NOTE — Progress Notes (Signed)
Did not attend group 

## 2017-01-11 DIAGNOSIS — G4700 Insomnia, unspecified: Secondary | ICD-10-CM

## 2017-01-11 DIAGNOSIS — F29 Unspecified psychosis not due to a substance or known physiological condition: Secondary | ICD-10-CM

## 2017-01-11 LAB — TSH: TSH: 1.492 u[IU]/mL (ref 0.350–4.500)

## 2017-01-11 LAB — LIPID PANEL
Cholesterol: 161 mg/dL (ref 0–200)
HDL: 38 mg/dL — ABNORMAL LOW (ref >40)
LDL CALC: 110 mg/dL — AB (ref 0–99)
Total CHOL/HDL Ratio: 4.2 RATIO
Triglycerides: 65 mg/dL (ref <150)
VLDL: 13 mg/dL (ref 0–40)

## 2017-01-11 LAB — HEMOGLOBIN A1C
HEMOGLOBIN A1C: 4.9 % (ref 4.8–5.6)
MEAN PLASMA GLUCOSE: 93.93 mg/dL

## 2017-01-11 MED ORDER — OLANZAPINE 10 MG IM SOLR: 5.0000 mg | Freq: Three times a day (TID) | INTRAMUSCULAR | Status: DC | PRN

## 2017-01-11 MED ORDER — BENZTROPINE MESYLATE 0.5 MG PO TABS
0.5000 mg | ORAL_TABLET | Freq: Two times a day (BID) | ORAL | Status: DC
  Administered 2017-01-11 – 2017-01-12 (×2): 0.5 mg via ORAL
  Filled 2017-01-11: qty 1
  Filled 2017-01-11: qty 14
  Filled 2017-01-11: qty 1
  Filled 2017-01-11: qty 14
  Filled 2017-01-11 (×2): qty 1

## 2017-01-11 MED ORDER — OLANZAPINE 5 MG PO TBDP
5.0000 mg | ORAL_TABLET | Freq: Three times a day (TID) | ORAL | Status: DC | PRN
  Administered 2017-01-11: 5 mg via ORAL
  Filled 2017-01-11: qty 1

## 2017-01-11 NOTE — Progress Notes (Signed)
[facility] MD Progress Note  01/11/2017 11:10 AM Kayla Ochoa  MRN:  [id number] Subjective: Pt states " I am fine."  Objective:Patient seen and chart reviewed.Discussed patient with treatment team.  I have reviewed notes in EHR per Dr. Jama Flavors. Patient today presents as guarded, vaguely irritable.  She reports she does not want to be in the hospital.  She however has been compliant with her medications per treatment team.  She has been noted on the unit as paranoid on and off.  Patient however denies this. Improvement noted with regards to her taking her meals, as well as taking care of her ADLs as well as being compliant on her medications.  We will continue to encourage and support.    Principal Problem: Major depressive disorder, recurrent, severe with psychotic features (HCC) Diagnosis:   Patient Active Problem List   Diagnosis Date Noted  . Cannabis use disorder, severe, dependence (HCC) [F12.20] 01/08/2017  . Major depressive disorder, recurrent episode, severe, with psychosis (HCC) [F33.3] 01/08/2017  . Major depressive disorder, recurrent, severe with psychotic features (HCC) [F33.3] 01/08/2017   Total Time spent with patient: 25 minutes  Past Psychiatric History: Please see H&P.   Past Medical History:  Past Medical History:  Diagnosis Date  . Anxiety   . Depression   . RA (rheumatoid arthritis) (HCC)   . Uveitis     Past Surgical History:  Procedure Laterality Date  . TYMPANOSTOMY TUBE PLACEMENT Bilateral 2001   Family History: Please see H&P.  Family Psychiatric  History: Please see H&P.  Social History: Please see H&P.  Social History   Substance and Sexual Activity  Alcohol Use No  . Frequency: Never     Social History   Substance and Sexual Activity  Drug Use Yes  . Types: Marijuana   Comment: 1-2 times a day. Last used: PTA.     Social History   Socioeconomic History  . Marital status: Single    Spouse name: None  . Number of children: None  .  Years of education: None  . Highest education level: None  Social Needs  . Financial resource strain: None  . Food insecurity - worry: None  . Food insecurity - inability: None  . Transportation needs - medical: None  . Transportation needs - non-medical: None  Occupational History  . None  Tobacco Use  . Smoking status: Former Smoker    Types: Cigars  . Smokeless tobacco: Never Used  Substance and Sexual Activity  . Alcohol use: No    Frequency: Never  . Drug use: Yes    Types: Marijuana    Comment: 1-2 times a day. Last used: PTA.   Marland Kitchen Sexual activity: None  Other Topics Concern  . None  Social History Narrative  . None   Additional Social History:                         Sleep: Fair  Appetite:  improving  Current Medications: Current Facility-Administered Medications  Medication Dose Route Frequency Provider Last Rate Last Dose  . acetaminophen (TYLENOL) tablet 650 mg  650 mg Oral Q4H PRN Charm Rings, NP      . alum & mag hydroxide-simeth (MAALOX/MYLANTA) 200-200-20 MG/5ML suspension 30 mL  30 mL Oral Q4H PRN Charm Rings, NP      . benztropine (COGENTIN) tablet 0.5 mg  0.5 mg Oral BID Basheer Molchan, MD      . citalopram (CELEXA) tablet 10 mg  10 mg Oral Daily Charm Rings, NP   10 mg at 01/11/17 0725  . hydrOXYzine (ATARAX/VISTARIL) tablet 25 mg  25 mg Oral Q6H PRN Micheal Likens, MD   25 mg at 01/10/17 2115  . magnesium hydroxide (MILK OF MAGNESIA) suspension 30 mL  30 mL Oral Daily PRN Charm Rings, NP      . OLANZapine zydis (ZYPREXA) disintegrating tablet 5 mg  5 mg Oral TID PRN Jomarie Longs, MD       Or  . OLANZapine (ZYPREXA) injection 5 mg  5 mg Intramuscular TID PRN Jomarie Longs, MD      . ondansetron (ZOFRAN) tablet 4 mg  4 mg Oral Q8H PRN Charm Rings, NP      . risperiDONE (RISPERDAL) tablet 1 mg  1 mg Oral BID Cobos, Rockey Situ, MD   1 mg at 01/11/17 0725  . traZODone (DESYREL) tablet 50 mg  50 mg Oral QHS,MR X  1 Nira Conn A, NP   50 mg at 01/10/17 2115    Lab Results:  Results for orders placed or performed during the hospital encounter of 01/08/17 (from the past 48 hour(s))  TSH     Status: None   Collection Time: 01/11/17  6:17 AM  Result Value Ref Range   TSH 1.492 0.350 - 4.500 uIU/mL    Comment: Performed by a 3rd Generation assay with a functional sensitivity of <=0.01 uIU/mL. Performed at [facility], [address] [place], [place] [address]   Lipid panel     Status: Abnormal   Collection Time: 01/11/17  6:17 AM  Result Value Ref Range   Cholesterol 161 0 - 200 mg/dL   Triglycerides 65 <768 mg/dL   HDL 38 (L) >08 mg/dL   Total CHOL/HDL Ratio 4.2 RATIO   VLDL 13 0 - 40 mg/dL   LDL Cholesterol 811 (H) 0 - 99 mg/dL    Comment:        Total Cholesterol/HDL:CHD Risk Coronary Heart Disease Risk Table                     Men   Women  1/2 Average Risk   3.4   3.3  Average Risk       5.0   4.4  2 X Average Risk   9.6   7.1  3 X Average Risk  23.4   11.0        Use the calculated Patient Ratio above and the CHD Risk Table to determine the patient's CHD Risk.        ATP III CLASSIFICATION (LDL):  <100     mg/dL   Optimal  031-594  mg/dL   Near or Above                    Optimal  130-159  mg/dL   Borderline  585-929  mg/dL   High  >244     mg/dL   Very High Performed at [facility], [address], [place], [place] [address]   Hemoglobin A1c     Status: None   Collection Time: 01/11/17  6:17 AM  Result Value Ref Range   Hgb A1c MFr Bld 4.9 4.8 - 5.6 %    Comment: (NOTE) Pre diabetes:          5.7%-6.4% Diabetes:              >6.4% Glycemic control for   <7.0%  adults with diabetes    Mean Plasma Glucose 93.93 mg/dL    Comment: Performed at [facility], [address], [place], [place] [address]    Blood Alcohol level:  Lab Results  Component Value Date   [facility] <10 01/07/2017   ETH <5 05/07/2016    Metabolic Disorder Labs: Lab  Results  Component Value Date   HGBA1C 4.9 01/11/2017   MPG 93.93 01/11/2017   No results found for: PROLACTIN Lab Results  Component Value Date   CHOL 161 01/11/2017   TRIG 65 01/11/2017   HDL 38 (L) 01/11/2017   CHOLHDL 4.2 01/11/2017   VLDL 13 01/11/2017   LDLCALC 110 (H) 01/11/2017    Physical Findings: AIMS: Facial and Oral Movements Muscles of Facial Expression: None, normal Lips and Perioral Area: None, normal Jaw: None, normal Tongue: None, normal,Extremity Movements Upper (arms, wrists, hands, fingers): None, normal Lower (legs, knees, ankles, toes): None, normal, Trunk Movements Neck, shoulders, hips: None, normal, Overall Severity Severity of abnormal movements (highest score from questions above): None, normal Incapacitation due to abnormal movements: None, normal Patient's awareness of abnormal movements (rate only patient's report): No Awareness, Dental Status Current problems with teeth and/or dentures?: No Does patient usually wear dentures?: No  CIWA:    COWS:     Musculoskeletal: Strength & Muscle Tone: within normal limits Gait & Station: normal Patient leans: N/A  Psychiatric Specialty Exam: Physical Exam  Nursing note and vitals reviewed.   Review of Systems  Psychiatric/Behavioral: The patient is nervous/anxious.   All other systems reviewed and are negative.   Blood pressure 110/71, pulse (!) 102, temperature 97.6 F (36.4 C), temperature source Oral, resp. rate 20, height 5\' 2"  (1.575 m), weight 39.5 kg (87 lb).Body mass index is 15.91 kg/m.  General Appearance: Casual  Eye Contact:  Fair  Speech:  Slow  Volume:  Decreased  Mood:  Anxious and Dysphoric  Affect:  Constricted  Thought Process:  Goal Directed and Descriptions of Associations: Circumstantial  Orientation:  Full (Time, Place, and Person)  Thought Content:  Paranoid Ideation, continues to be suspicious   Suicidal thoughts - No - however is paranoid and hence possibly a  danger to self or others.  Homicidal Thoughts:  No  Memory:  Immediate;   Fair Recent;   Fair Remote;   Fair  Judgement:  Fair  Insight:  Fair  Psychomotor Activity:  Normal  Concentration:  Concentration: Fair and Attention Span: Fair  Recall:  of Knowledge:  Fair  Language:  Fair  Akathisia:  No  Handed:  Right  AIMS (if indicated):    mild tremors noted  Assets:  Communication Skills Desire for Improvement Social Support  ADL's:  Intact  Cognition:  WNL  Sleep:  Number of Hours: 6.75     Treatment Plan Summary: 21 year old female who was admitted under IVC for worsening paranoia and worsening ADLs.  She has been noted as improving on the current medication regimen.  Will add Cogentin to address her mild tremors.  Continue to monitor. Continue plan as noted below. Daily contact with patient to assess and evaluate symptoms and progress in treatment, Medication management and Plan see below   For psychosis Continue risperidone 1 mg p.o. twice daily  For affective symptoms Celexa 10 mg p.o. Daily.   For EPS Add Cogentin 0.5 mg p.o. twice daily.  For insomnia Trazodone 50 mg p.o. Nightly.  Labs reviewed-lipid panel, hemoglobin A1c, TSH-within normal limits Will order  BMP-since her potassium level was low on admission.  Continue to encourage to attend groups.  Vital signs reviewed, noted as 102 . Will monitor closely.  CSW to continue to work on disposition.       Jomarie Longs, MD 01/11/2017, 11:10 AM

## 2017-01-11 NOTE — Progress Notes (Signed)
Patient ID: Kayla Ochoa, female   DOB: 07-23-1995, 21 y.o.   MRN: [id number]  Pt currently presents with an appropriate affect and guarded behavior. Pt forwards little to Clinical research associate. Pt reports to writer that their goal is to "talk with the doctor tomorrow." Pt states "I am just sleepy."   Pt's labs and vitals were monitored throughout the night. Pt given a 1:1 about emotional and mental status. Pt supported and encouraged to express concerns and questions.   Pt's safety ensured with 15 minute and environmental checks. Pt currently denies SI/HI and A/V hallucinations. Pt verbally agrees to seek staff if SI/HI or A/VH occurs and to consult with staff before acting on any harmful thoughts. Will continue POC.

## 2017-01-11 NOTE — [facility] Group Notes (Signed)
[facility] LCSW Group Therapy Note  Date/Time:  01/11/2017  11:00AM-12:00PM  Type of Therapy and Topic:  Group Therapy:  Music and Mood  Participation Level:  Active   Description of Group: In this process group, members listened to a variety of genres of music and identified that different types of music evoke different responses.  Patients were encouraged to identify music that was soothing for them and music that was energizing for them.  Patients discussed how this knowledge can help with wellness and recovery in various ways including managing depression and anxiety as well as encouraging healthy sleep habits.    Therapeutic Goals: 1. Patients will explore the impact of different varieties of music on mood 2. Patients will verbalize the thoughts they have when listening to different types of music 3. Patients will identify music that is soothing to them as well as music that is energizing to them 4. Patients will discuss how to use this knowledge to assist in maintaining wellness and recovery 5. Patients will explore the use of music as a coping skill  Summary of Patient Progress:  At the beginning of group, patient expressed she felt good and at the end said the music had made no difference and she still felt good.  During group she was argumentative and demanding.  Therapeutic Modalities: Solution Focused Brief Therapy Motivational Interviewing Activity   Ambrose Mantle, LCSW 01/11/2017 12:03 PM

## 2017-01-11 NOTE — Progress Notes (Signed)
D: Pt presents with a flat affect and anxious mood. Pt appears to be guarded during shift assessment. Pt forwarded little information and appeared to be minimizing her symptoms. Pt noted to be irritable and easily agitated this afternoon. Pt had requested to see the MD several times. Writer notified MD. Writer explained to pt that the MD will see her next. Pt had a difficult time maintaining control. Pt was pacing by the door and getting upset. Pt had to be given prn Zyprexa for agitation.  A: Orders reviewed with pt. Medications administered as ordered per MD. Verbal support provided. Pt encouraged to attend groups. 15 minute checks performed for safety. R: Pt compliant with tx plan.

## 2017-01-12 LAB — BASIC METABOLIC PANEL
Anion gap: 8 (ref 5–15)
BUN: 10 mg/dL (ref 6–20)
CO2: 25 mmol/L (ref 22–32)
CREATININE: 0.85 mg/dL (ref 0.44–1.00)
Calcium: 9.6 mg/dL (ref 8.9–10.3)
Chloride: 107 mmol/L (ref 101–111)
GFR calc Af Amer: >60 mL/min (ref >60)
Glucose, Bld: 87 mg/dL (ref 65–99)
POTASSIUM: 3.8 mmol/L (ref 3.5–5.1)
SODIUM: 140 mmol/L (ref 135–145)

## 2017-01-12 LAB — PROLACTIN: PROLACTIN: 97.6 ng/mL — AB (ref 4.8–23.3)

## 2017-01-12 MED ORDER — BENZTROPINE MESYLATE 0.5 MG PO TABS: 0.5000 mg | ORAL_TABLET | Freq: Two times a day (BID) | ORAL | 0 refills | Status: DC

## 2017-01-12 MED ORDER — TRAZODONE HCL 50 MG PO TABS
50.0000 mg | ORAL_TABLET | Freq: Every day | ORAL | Status: DC
Start: 2017-01-12 — End: 2017-01-12
  Filled 2017-01-12 (×2): qty 7

## 2017-01-12 MED ORDER — CITALOPRAM HYDROBROMIDE 10 MG PO TABS: 10.0000 mg | ORAL_TABLET | Freq: Every day | ORAL | 0 refills | Status: DC

## 2017-01-12 MED ORDER — HYDROXYZINE HCL 25 MG PO TABS: 25.0000 mg | ORAL_TABLET | Freq: Four times a day (QID) | ORAL | 0 refills | Status: DC | PRN

## 2017-01-12 MED ORDER — TRAZODONE HCL 50 MG PO TABS: ORAL_TABLET | ORAL | 0 refills | Status: DC

## 2017-01-12 MED ORDER — RISPERIDONE 1 MG PO TABS: 1.0000 mg | ORAL_TABLET | Freq: Two times a day (BID) | ORAL | 0 refills | Status: DC

## 2017-01-12 NOTE — Progress Notes (Signed)
Recreation Therapy Notes  Date: 01/12/17 Time: 1000 Location: 500 Hall Dayroom  Group Topic: Goal Setting  Goal Area(s) Addresses:  Patient will be able to identify at least 3 life goals.  Patient will be able to identify benefit of investing in life goals.  Patient will be able to identify benefit of setting life goals.   Behavioral Response:  Engaged  Intervention: Worksheet  Activity: Garment/textile technologist.  Patients were to create goals they hope to accomplish within the next week, month, year and five years.  Patients were to also identify any obstacles that would prevent them from reaching their goals; what they need to do to achieve their goals and what they can start doing tomorrow to work on their goals.  Education:  Discharge Planning, Coping Skills, Life Goals   Education Outcome: Acknowledges Education/In Group Clarification Provided/Needs Additional Education  Clinical Observations: Pt stated goals "help you feel accomplished".  Pt identified her goals at: week- listen more to family; month- make enough money to for sisters to make up for them not having a good Christmas last year; 1 year- start school; 5 years- have own apartment and car.  Pt identified obstacles as family not supporting her.  Pt expressed she needs to respect her family more and she can start tomorrow by having better hygiene and speak positive to herself.  Pt explained that she is a perfectionist and puts to much pressure on herself.  Pt also stated she doesn't express how thankful she is for the things she has.  Lastly, pt stated when people aren't supportive of her, it makes her want to "overcme".   Caroll Rancher, LRT/CTRS        Caroll Rancher A 01/12/2017 11:34 AM

## 2017-01-12 NOTE — Progress Notes (Signed)
Patient discharged to lobby. Patient was stable and appreciative at that time. All papers, samples and prescriptions were given and valuables returned. Verbal understanding expressed. Denies SI/HI and A/VH. Patient given opportunity to express concerns and ask questions.  

## 2017-01-12 NOTE — Plan of Care (Signed)
Pt showed improved self esteem at completion of goal setting recreation therapy session.   Caroll Rancher, LRT/CTRS

## 2017-01-12 NOTE — Progress Notes (Signed)
Adult Psychoeducational Group Note  Date:  01/12/2017 Time:  12:04 AM  Group Topic/Focus:  Wrap-Up Group:   The focus of this group is to help patients review their daily goal of treatment and discuss progress on daily workbooks.  Participation Level:  Did Not Attend  Participation Quality:  Did Not Attend  Affect:  Did Not Attend  Cognitive:  Did Not Attend  Insight: None  Engagement in Group:  Did Not Attend  Modes of Intervention:  Did Not Attend  Additional Comments:  Pt did not attend evening wrap up group tonight.  Felipa Furnace 01/12/2017, 12:04 AM

## 2017-01-12 NOTE — Discharge Summary (Signed)
Physician Discharge Summary Note  Patient:  Kayla Ochoa is an 21 y.o., female MRN:  [id number] DOB:  09/24/1995  Patient phone:  [phone] (home)   Patient address:   [address] [place] [address],   Total Time spent with patient: Greater than 30 minutes  Date of Admission:  01/08/2017 Date of Discharge: 01-12-17  Reason for Admission: Worsening psychosis, paranoia, poor self care.  Principal Problem: Major depressive disorder, recurrent, severe with psychotic features [facility])  Discharge Diagnoses: Patient Active Problem List   Diagnosis Date Noted  . Cannabis use disorder, severe, dependence (HCC) [F12.20] 01/08/2017  . Major depressive disorder, recurrent episode, severe, with psychosis (HCC) [F33.3] 01/08/2017  . Major depressive disorder, recurrent, severe with psychotic features (HCC) [F33.3] 01/08/2017   Past Psychiatric History: MDD, Cannabis use disorder  Past Medical History:  Past Medical History:  Diagnosis Date  . Anxiety   . Depression   . RA (rheumatoid arthritis) (HCC)   . Uveitis     Past Surgical History:  Procedure Laterality Date  . TYMPANOSTOMY TUBE PLACEMENT Bilateral 2001   Family History: History reviewed. No pertinent family history.  Family Psychiatric  History: See  H&P  Social History:  Social History   Substance and Sexual Activity  Alcohol Use No  . Frequency: Never     Social History   Substance and Sexual Activity  Drug Use Yes  . Types: Marijuana   Comment: 1-2 times a day. Last used: PTA.     Social History   Socioeconomic History  . Marital status: Single    Spouse name: None  . Number of children: None  . Years of education: None  . Highest education level: None  Social Needs  . Financial resource strain: None  . Food insecurity - worry: None  . Food insecurity - inability: None  . Transportation needs - medical: None  . Transportation needs - non-medical: None  Occupational History  . None   Tobacco Use  . Smoking status: Former Smoker    Types: Cigars  . Smokeless tobacco: Never Used  Substance and Sexual Activity  . Alcohol use: No    Frequency: Never  . Drug use: Yes    Types: Marijuana    Comment: 1-2 times a day. Last used: PTA.   Marland Kitchen Sexual activity: None  Other Topics Concern  . None  Social History Narrative  . None   Hospital Course:  Kayla Ochoa is a 21 y/o F with history of MDD with psychotic features and cannabis use disorder who was admitted on IVC with worsening symptoms of psychosis including paranoia and not meeting her basic ADL's such as hygiene and feeding herself. Pt was at home and acting bizarrely when her parents called the police who placed pt on IVC and brought her to ED for evaluation. Pt was asked about the reasons for her presentation, and she explains, "My mom felt like I was paranoid." Pt is somewhat vague with her responses. She continues, "My phone speaker was going up and down, so I knew something was wrong, but then she called the cops." Pt explains that she recently moved to the area from [place], and she began to notice strange occurrence such as that she was being followed or people have been watching her through the windows. She explains that her phone began to act bizarrely and she was concerned that others were attempting to monitor her or steal her information. During the interview, pt asks that the blinds be drawn  on the window so that she cannot be observed through the window. She endorses recent AH of hearing voices stating that she is "fake" or other derogatory comments. She endorses VH of seeing "the highlighter on the paper disappear and then reappear." She endorses SI without plan or intention of attempting.  Levetta was admitted to the [facility] for worsening psychosis, delusions & paranoia. She presented to the hospital very delusional. She was in need of mood stabilization treatments. After her admission assessment, Stella's presenting  symptoms were identified. The medication regimen targeting those symptoms were initiated. She was medicated & discharged on; Cogentin 0.5 mg for EPS, Citalopram 10 mg for depression, Hydroxyzine 25 mg prn for anxiety, Risperdal 1 mg & 2 mg respectively for mood control & Trazodone 50 mg for insomnia. She presented no other significant pre-existing medical problems that required treatment or monitoring. She tolerated her treatment regimen without any adverse effects reported.  As her treatment progressed, Kayla Ochoa's improvement was monitored & noted by observation & her daily reports of symptom reduction noted. Her emotional & mental status were also monitored by daily self-inventory assessment reports completed by her & the clinical staff. She was evaluated by the treatment team for stability and plans for continued recovery after discharge. She was recommended for further treatment options upon discharge by referring & scheduling her an outpatient psychiatric clinic for follow-up visits & medication managment as listed below.     Upon discharge, Moriya was both mentally and medically stable denying SIHI,, auditory/visual/tactile hallucinations, delusional thoughts & or paranoia. She was provided with a 7 days worth supply samples of her [facility] discharge medications. She left [facility] in no apparent distress with all personal belongings. Transportation per mother.  Physical Findings: AIMS: Facial and Oral Movements Muscles of Facial Expression: None, normal Lips and Perioral Area: None, normal Jaw: None, normal Tongue: None, normal,Extremity Movements Upper (arms, wrists, hands, fingers): None, normal Lower (legs, knees, ankles, toes): None, normal, Trunk Movements Neck, shoulders, hips: None, normal, Overall Severity Severity of abnormal movements (highest score from questions above): None, normal Incapacitation due to abnormal movements: None, normal Patient's awareness of abnormal movements (rate only  patient's report): No Awareness, Dental Status Current problems with teeth and/or dentures?: No Does patient usually wear dentures?: No  CIWA:    COWS:     Musculoskeletal: Strength & Muscle Tone: within normal limits Gait & Station: normal Patient leans: N/A  Psychiatric Specialty Exam: Physical Exam  Nursing note and vitals reviewed. Constitutional: She appears well-developed.  HENT:  Head: Normocephalic.  Eyes: Pupils are equal, round, and reactive to light.  Neck: Normal range of motion.  Cardiovascular: Normal rate.  Respiratory: Effort normal.  GI: Soft.  Genitourinary:  Genitourinary Comments: Deferred  Musculoskeletal: Normal range of motion.  Neurological: She is alert.  Skin: Skin is warm.    ROS  Blood pressure 138/77, pulse (!) 109, temperature 98.2 F (36.8 C), resp. rate 16, height 5\' 2"  (1.575 m), weight 39.5 kg (87 lb).Body mass index is 15.91 kg/m.  See H&P   Have you used any form of tobacco in the last 30 days? (Cigarettes, Smokeless Tobacco, Cigars, and/or Pipes): No  Has this patient used any form of tobacco in the last 30 days? (Cigarettes, Smokeless Tobacco, Cigars, and/or Pipes): N/A  Blood Alcohol level:  Lab Results  Component Value Date   [facility] <10 01/07/2017   ETH <5 05/07/2016   Metabolic Disorder Labs:  Lab Results  Component Value Date   HGBA1C 4.9 01/11/2017  MPG 93.93 01/11/2017   Lab Results  Component Value Date   PROLACTIN 97.6 (H) 01/11/2017   Lab Results  Component Value Date   CHOL 161 01/11/2017   TRIG 65 01/11/2017   HDL 38 (L) 01/11/2017   CHOLHDL 4.2 01/11/2017   VLDL 13 01/11/2017   LDLCALC 110 (H) 01/11/2017   See Psychiatric Specialty Exam and Suicide Risk Assessment completed by Attending Physician prior to discharge.  Discharge destination:  Home  Is patient on multiple antipsychotic therapies at discharge:  No   Has Patient had three or more failed trials of antipsychotic monotherapy by history:   No  Recommended Plan for Multiple Antipsychotic Therapies: NA  Allergies as of 01/12/2017      Reactions   Iodine Anaphylaxis   Fish Allergy Diarrhea, Swelling   Swelling to face. Tested positive   Other Swelling   Tree nuts Pecans strawberries   Shrimp [shellfish Allergy] Swelling      Medication List    STOP taking these medications   albuterol 108 (90 Base) MCG/ACT inhaler Commonly known as:  PROVENTIL HFA;VENTOLIN HFA   dolutegravir 50 MG tablet Commonly known as:  TIVICAY   emtricitabine-tenofovir AF 200-25 MG tablet Commonly known as:  DESCOVY   ibuprofen 800 MG tablet Commonly known as:  ADVIL,MOTRIN   venlafaxine XR 75 MG 24 hr capsule Commonly known as:  EFFEXOR-XR     TAKE these medications     Indication  benztropine 0.5 MG tablet Commonly known as:  COGENTIN Take 1 tablet (0.5 mg total) 2 (two) times daily by mouth. For prevention of drug induced tremors  Indication:  Extrapyramidal Reaction caused by Medications   citalopram 10 MG tablet Commonly known as:  CELEXA Take 1 tablet (10 mg total) daily by mouth. For depression Start taking on:  01/13/2017  Indication:  Depression, Social Anxiety Disorder   hydrOXYzine 25 MG tablet Commonly known as:  ATARAX/VISTARIL Take 1 tablet (25 mg total) every 6 (six) hours as needed by mouth for anxiety.  Indication:  Feeling Anxious   risperiDONE 1 MG tablet Commonly known as:  RISPERDAL Take 1 tablet (1 mg total) 2 (two) times daily by mouth. For mood control  Indication:  Mood control   traZODone 50 MG tablet Commonly known as:  DESYREL Take 1 tablet (50 mg) by mouth at bedtime: For sleep  Indication:  Trouble Sleeping      Follow-up Information    [facility] 01[facility]29/2017.   Why:  Thursday Dec 6 at 9AM with Graylin Shiver for therapy.  Then, Tuesday, December 11 at [facility] with Dr A. Contact information: [address] [place] [address] [phone]           Follow-up recommendations: Activity:  As tolerated Diet: As recommended by your primary care doctor. Keep all scheduled follow-up appointments as recommended.   Comments: Patient is instructed prior to discharge to: Take all medications as prescribed by his/her mental healthcare provider. Report any adverse effects and or reactions from the medicines to his/her outpatient provider promptly. Patient has been instructed & cautioned: To not engage in alcohol and or illegal drug use while on prescription medicines. In the event of worsening symptoms, patient is instructed to call the crisis hotline, 911 and or go to the nearest ED for appropriate evaluation and treatment of symptoms. To follow-up with his/her primary care provider for your other medical issues, concerns and or health care needs.   Signed: Armandina Stammer  I, NP, PMHNP, FNP-BC 01/12/2017, 4:12 PM   Patient seen, Suicide Assessment Completed.  Disposition Plan Reviewed   Claudean Kindshjajah Manukyan is a 21 y/o F with preferred name of "JJ" who was admitted with worsening paranoia and inability to meet her basic ADL's and hygiene. Pt had also been using cannabis. Pt was started on risperdal and she had improvement of psychotic symptoms during her admission. Today upon evaluation, she reports that she is doing well overall. She denies SI/HI/AH/VH. She is sleeping well and her appetite is good. She is tolerating her current medications without difficulty or side effects. She states, "That medication helps me be calm." Pt is future oriented about returning home to be with her family for Thanksgiving. She is [facility] as she was there before. She was able to engage in safety planning including plan [facility] or contact emergency services if she feels unable to maintain her own safety. Pt agrees to continue her current treatment regimen without changes. She has been participating in groups and she  has been maintain her ADL's while on the unit. She is appropriate for discharge to outpatient level of care today. She had no further questions, comments, or concerns.  Plan Of Care/Follow-up recommendations:  - Discharge to outpatient level of care - Psychosis unspecified vs MDD with psychotic features - Continue risperdal 1mg  BID - Continue celexa 10mg  qDay - Anxiety - Continue atarax 25mg  q6h prn anxiety - EPS                         - Continue cogentin 0.5mg  BID  Activity:  as tolerated Diet:  normal Tests:  NA Other:  see above for DC plan  Micheal Likenshristopher T Rajah Lamba, MD

## 2017-01-12 NOTE — Progress Notes (Signed)
Recreation Therapy Notes  INPATIENT RECREATION TR PLAN  Patient Details Name: Kayla Ochoa MRN: [id number] DOB: 05/01/1995 Today's Date: 01/12/2017  Rec Therapy Plan Is patient appropriate for Therapeutic Recreation?: Yes Treatment times per week: about 3 days Estimated Length of Stay: 5-7 days TR Treatment/Interventions: Group participation (Comment)  Discharge Criteria Pt will be discharged from therapy if:: Discharged Treatment plan/goals/alternatives discussed and agreed upon by:: Patient/family  Discharge Summary Short term goals set: Pt will show improved self esteem by being able to identify at leaat 5 positive traits about self. Short term goals met: Adequate for discharge Progress toward goals comments: Groups attended Which groups?: Goal setting Reason goals not met: None Therapeutic equipment acquired: N/A Reason patient discharged from therapy: Discharge from hospital Pt/family agrees with progress & goals achieved: Yes Date patient discharged from therapy: 01/12/17   Victorino Sparrow, LRT/CTRS  Ria Comment, Davine Coba A 01/12/2017, 12:22 PM

## 2017-01-12 NOTE — [facility] Suicide Risk Assessment (Signed)
[facility] Discharge Suicide Risk Assessment   Principal Problem: Major depressive disorder, recurrent, severe with psychotic features [facility]) Discharge Diagnoses:  Patient Active Problem List   Diagnosis Date Noted  . Cannabis use disorder, severe, dependence (HCC) [F12.20] 01/08/2017  . Major depressive disorder, recurrent episode, severe, with psychosis (HCC) [F33.3] 01/08/2017  . Major depressive disorder, recurrent, severe with psychotic features (HCC) [F33.3] 01/08/2017    Total Time spent with patient: 30 minutes  Musculoskeletal: Strength & Muscle Tone: within normal limits Gait & Station: normal Patient leans: N/A  Psychiatric Specialty Exam: Review of Systems  Constitutional: Negative for chills and fever.  Respiratory: Negative for cough and shortness of breath.   Cardiovascular: Negative for chest pain.  Gastrointestinal: Negative for abdominal pain, heartburn, nausea and vomiting.  Psychiatric/Behavioral: Negative for depression, hallucinations and suicidal ideas. The patient is not nervous/anxious.     Blood pressure 138/77, pulse (!) 109, temperature 98.2 F (36.8 C), resp. rate 16, height 5\' 2"  (1.575 m), weight 39.5 kg (87 lb).Body mass index is 15.91 kg/m.  General Appearance: Casual and Fairly Groomed  ::  Good  Speech:  Clear and Coherent and Normal Rate  Volume:  Normal  Mood:  Euthymic  Affect:  Appropriate and Congruent  Thought Process:  Coherent and Goal Directed  Orientation:  Full (Time, Place, and Person)  Thought Content:  Logical  Suicidal Thoughts:  No  Homicidal Thoughts:  No  Memory:  Immediate;   Good Recent;   Good Remote;   Good  Judgement:  Fair  Insight:  Fair  Psychomotor Activity:  Normal  Concentration:  Fair  Recall:  [id number] of Knowledge:Fair  Language: Fair  Akathisia:  No  Handed:    AIMS (if indicated):     Assets:  Communication Skills Desire for Improvement Financial Resources/Insurance Physical  Health Resilience Social Support  Sleep:  Number of Hours: 6.5  Cognition: WNL  ADL's:  Intact   Mental Status Per Nursing Assessment::   On Admission:     Demographic Factors:  Adolescent or young adult and Low socioeconomic status  Loss Factors: NA  Historical Factors: Family history of mental illness or substance abuse and Impulsivity  Risk Reduction Factors:   Living with another person, especially a relative, Positive social support, Positive therapeutic relationship and Positive coping skills or problem solving skills  Continued Clinical Symptoms:  Depression:   Delusional Alcohol/Substance Abuse/Dependencies  Cognitive Features That Contribute To Risk:  None    Suicide Risk:  Minimal: No identifiable suicidal ideation.  Patients presenting with no risk factors but with morbid ruminations; may be classified as minimal risk based on the severity of the depressive symptoms   Subjective data: Kayla Ochoa is a 21 y/o F with preferred name of "JJ" who was admitted with worsening paranoia and inability to meet her basic ADL's and hygiene. Pt had also been using cannabis. Pt was started on risperdal and she had improvement of psychotic symptoms during her admission. Today upon evaluation, she reports that she is doing well overall. She denies SI/HI/AH/VH. She is sleeping well and her appetite is good. She is tolerating her current medications without difficulty or side effects. She states, "That medication helps me be calm." Pt is future oriented about returning home to be with her family for Thanksgiving. She is willing to follow up at [facility] for outpatient care as she was there before. She was able to engage in safety planning including plan to return to [facility] or contact  emergency services if she feels unable to maintain her own safety. Pt agrees to continue her current treatment regimen without changes. She has been participating in groups and she has been maintain her  ADL's while on the unit. She is appropriate for discharge to outpatient level of care today. She had no further questions, comments, or concerns.  Plan Of Care/Follow-up recommendations:  - Discharge to outpatient level of care - Psychosis unspecified vs MDD with psychotic features             - Continue risperdal 1mg  BID             - Continue celexa 10mg  qDay - Anxiety             - Continue atarax 25mg  q6h prn anxiety - EPS    - Continue cogentin 0.5mg  BID  Activity:  as tolerated Diet:  normal Tests:  NA Other:  see above for DC plan  , MD 01/12/2017, 9:59 AM

## 2017-01-12 NOTE — Progress Notes (Signed)
  [facility] Adult Case Management Discharge Plan :  Will you be returning to the same living situation after discharge:  Yes,  Going home with mother At discharge, do you have transportation home?: Yes,  Bus pass Do you have the ability to pay for your medications: Yes,  Insurance  Release of information consent forms completed and in the chart;  Patient's signature needed at discharge.  Patient to Follow up at: Follow-up Information    [facility] Follow up on 01/29/2017.   Why:  Thursday Dec 6 at 9AM with Graylin Shiver for therapy.  Then, Tuesday, December 11 at [facility] with Dr A. Contact information: [address] [place] [address] [phone]           Next level of care provider has access to [facility] Link:no  Safety Planning and Suicide Prevention discussed: Yes,  Yes  Have you used any form of tobacco in the last 30 days? (Cigarettes, Smokeless Tobacco, Cigars, and/or Pipes): No  Has patient been referred to the Quitline?: N/A patient is not a smoker  Patient has been referred for addiction treatment: Pt. refused referral  Johny Shears, LCSW 01/12/2017, 11:03 AM

## 2017-02-19 ENCOUNTER — Telehealth (HOSPITAL_COMMUNITY): Payer: Self-pay

## 2017-03-03 ENCOUNTER — Other Ambulatory Visit: Payer: Self-pay

## 2017-03-03 ENCOUNTER — Encounter (HOSPITAL_COMMUNITY): Payer: Self-pay

## 2017-03-03 ENCOUNTER — Emergency Department (HOSPITAL_COMMUNITY)
Admission: EM | Admit: 2017-03-03 | Discharge: 2017-03-04 | Disposition: A | Discharge status: Home / Self Care | Payer: BLUE CROSS/BLUE SHIELD | Attending: Emergency Medicine | Admitting: Emergency Medicine

## 2017-03-03 DIAGNOSIS — Z79899 Other long term (current) drug therapy: Secondary | ICD-10-CM | POA: Diagnosis not present

## 2017-03-03 DIAGNOSIS — Z87891 Personal history of nicotine dependence: Secondary | ICD-10-CM | POA: Insufficient documentation

## 2017-03-03 DIAGNOSIS — F1290 Cannabis use, unspecified, uncomplicated: Secondary | ICD-10-CM

## 2017-03-03 DIAGNOSIS — T7840XA Allergy, unspecified, initial encounter: Secondary | ICD-10-CM | POA: Insufficient documentation

## 2017-03-03 DIAGNOSIS — T50905A Adverse effect of unspecified drugs, medicaments and biological substances, initial encounter: Secondary | ICD-10-CM | POA: Diagnosis not present

## 2017-03-03 DIAGNOSIS — J45909 Unspecified asthma, uncomplicated: Secondary | ICD-10-CM | POA: Insufficient documentation

## 2017-03-03 HISTORY — DX: Unspecified asthma, uncomplicated: J45.909

## 2017-03-03 LAB — CBC
HEMATOCRIT: 36.8 % (ref 36.0–46.0)
HEMOGLOBIN: 12.2 g/dL (ref 12.0–15.0)
MCH: 27.9 pg (ref 26.0–34.0)
MCHC: 33.2 g/dL (ref 30.0–36.0)
MCV: 84 fL (ref 78.0–100.0)
Platelets: 308 10*3/uL (ref 150–400)
RBC: 4.38 MIL/uL (ref 3.87–5.11)
RDW: 13.6 % (ref 11.5–15.5)
WBC: 9.6 10*3/uL (ref 4.0–10.5)

## 2017-03-03 LAB — COMPREHENSIVE METABOLIC PANEL
ALBUMIN: 4.8 g/dL (ref 3.5–5.0)
ALK PHOS: 50 U/L (ref 38–126)
ALT: 22 U/L (ref 14–54)
ANION GAP: 12 (ref 5–15)
AST: 32 U/L (ref 15–41)
BUN: 11 mg/dL (ref 6–20)
CALCIUM: 9.3 mg/dL (ref 8.9–10.3)
CHLORIDE: 106 mmol/L (ref 101–111)
CO2: 21 mmol/L — AB (ref 22–32)
Creatinine, Ser: 0.91 mg/dL (ref 0.44–1.00)
GFR calc non Af Amer: >60 mL/min (ref >60)
Glucose, Bld: 140 mg/dL — ABNORMAL HIGH (ref 65–99)
Potassium: 3 mmol/L — ABNORMAL LOW (ref 3.5–5.1)
SODIUM: 139 mmol/L (ref 135–145)
Total Bilirubin: 0.8 mg/dL (ref 0.3–1.2)
Total Protein: 8 g/dL (ref 6.5–8.1)

## 2017-03-03 LAB — ACETAMINOPHEN LEVEL

## 2017-03-03 LAB — ETHANOL: Alcohol, Ethyl (B): <10 mg/dL (ref <10)

## 2017-03-03 LAB — I-STAT BETA HCG BLOOD, ED (MC, WL, AP ONLY)

## 2017-03-03 LAB — SALICYLATE LEVEL

## 2017-03-03 MED ORDER — LORAZEPAM 2 MG/ML IJ SOLN
0.5000 mg | Freq: Once | INTRAMUSCULAR | Status: AC
  Administered 2017-03-03: 0.5 mg via INTRAVENOUS
  Filled 2017-03-03: qty 1

## 2017-03-03 NOTE — ED Notes (Signed)
ED Provider at bedside. 

## 2017-03-03 NOTE — ED Triage Notes (Signed)
Patient presented to the [facility] waiting room laying on the floor and having uncontrolable body movements.  Uncle is unaware of what had happened, patient advised that "I can't stop it"  Patient brought back to patient room.

## 2017-03-04 DIAGNOSIS — T7840XA Allergy, unspecified, initial encounter: Secondary | ICD-10-CM | POA: Diagnosis not present

## 2017-03-04 LAB — RAPID URINE DRUG SCREEN, HOSP PERFORMED
AMPHETAMINES: NOT DETECTED
BARBITURATES: NOT DETECTED
BENZODIAZEPINES: NOT DETECTED
COCAINE: NOT DETECTED
Opiates: NOT DETECTED
TETRAHYDROCANNABINOL: POSITIVE — AB

## 2017-03-04 MED ORDER — POTASSIUM CHLORIDE CRYS ER 20 MEQ PO TBCR
40.0000 meq | EXTENDED_RELEASE_TABLET | Freq: Once | ORAL | Status: AC
  Administered 2017-03-04: 40 meq via ORAL
  Filled 2017-03-04: qty 2

## 2017-03-04 MED ORDER — ALBUTEROL SULFATE HFA 108 (90 BASE) MCG/ACT IN AERS
2.0000 | INHALATION_SPRAY | RESPIRATORY_TRACT | Status: DC
  Administered 2017-03-04: 2 via RESPIRATORY_TRACT
  Filled 2017-03-04: qty 6.7

## 2017-03-04 MED ORDER — SODIUM CHLORIDE 0.9 % IV SOLN
Freq: Once | INTRAVENOUS | Status: AC
  Administered 2017-03-04: via INTRAVENOUS

## 2017-03-04 MED ORDER — ALBUTEROL SULFATE HFA 108 (90 BASE) MCG/ACT IN AERS: 1.0000 | INHALATION_SPRAY | Freq: Four times a day (QID) | RESPIRATORY_TRACT | 0 refills | Status: DC | PRN

## 2017-03-04 NOTE — ED Provider Notes (Signed)
Pt assessed by TTS.  They feel pt is stable for outpatient treatment.  I reevaluated pt.  Pt feels well.  She denies any complaints.  No thoughts of harming herself.  Pt reports she took abilify and smoked marijuana with it.  Pt understands this was a problem for her.  Pt agrees she will take her medications as directed and will avoid street drugs/marijuana in the future.   Elson Areas, [place] 03/04/17 0962    Geoffery Lyons, MD 03/05/17 [phone]

## 2017-03-04 NOTE — ED Notes (Signed)
VENTOLIN HFA GIVEN TO PT. Kayla Ochoa ASSIT DIRT RN AT BEDSIDE TO DISCHARGE PT

## 2017-03-04 NOTE — ED Notes (Signed)
Date and time results received: 03/04/17 10:06 AM  (use smartphrase ".now" to insert current time)  Test: K+ Critical Value: 2.7  Name of Provider Notified: Trisha Mangle, P.A.  Orders Received? Or Actions Taken?:

## 2017-03-04 NOTE — [facility] Counselor (Signed)
Pt sleeping, unable to engage to complete the assessment.  TTS counselor to check back later.

## 2017-03-04 NOTE — ED Notes (Signed)
TTS PRESENT 

## 2017-03-04 NOTE — ED Provider Notes (Signed)
Care signed out to me at shift change.  This patient reports taking a dose of her Abilify, then experiencing rapid heart rate along with flailing of her extremities.  She was initially tachycardic.  Workup was initiated which was essentially unremarkable.  She has remained in the emergency department overnight and appears comfortable.  A TTS consult was placed secondary to the patient's history of psychiatric illness and possible concern of ingestion.  TTS attempted to consult on the patient, however she remained to stop fluid for them to evaluate.  Another attempt at evaluation will be undertaken this morning once the patient is more alert.   Geoffery Lyons, MD 03/04/17 [phone]
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
## 2017-03-04 NOTE — ED Notes (Signed)
ED Provider at bedside. 

## 2017-03-04 NOTE — ED Notes (Signed)
MICHAEL CARNSON [phone] (UNCLE) - PLEASE CALL IF DISCHARGED. BELONGINGS IN CABINETS SPACE ABOVE NURSING STATION ABOVE 19-22. THIS REPORTED TO ME BY LISA A RN

## 2017-03-04 NOTE — ED Notes (Signed)
TTS in to see pt  

## 2017-03-04 NOTE — Discharge Instructions (Signed)
For your ongoing behavioral health needs, you are advised to continue treatment at the [facility].  They offer both psychiatry/medication management and counseling.  Contact them at your earliest opportunity to schedule an appointment with a counselor:       [facility]      [address]      [place], [place] [address]      [phone]

## 2017-03-04 NOTE — ED Provider Notes (Signed)
[facility]-EMERGENCY DEPT Provider Note   CSN: [id number] Arrival date & time: 03/03/17  2157     History   Chief Complaint Chief Complaint  Patient presents with  . Allergic Reaction    HPI Kayla Ochoa is a 22 y.o. female.  HPI Reports that she took 1 of her Abilify and had an "allergic reaction".  She states that is what made her feel very agitated and weird.  Patient is here with her uncle.  He reports that he and a cousin are trying to help her and have been supportive in providing the living arrangements.  The patient has been kicked out of her mother's home.  Her uncle reports that he thought that the problem was that her mother was to hard on her and always fighting.  He reports now he is very concerned because the event that he saw tonight was extremely disconcerting.  The patient reportedly took a pill that she said was her regular medications.  Shortly after that she started acting extremely strange and shaking her arms and legs around but then getting up and standing and walking around more normally.  She would go through different bouts of these episodes of unusual movements saying that she felt really weird and dizzy. Past Medical History:  Diagnosis Date  . Anxiety   . Asthma   . Depression   . Uveitis     Patient Active Problem List   Diagnosis Date Noted  . Cannabis use disorder, severe, dependence (HCC) 01/08/2017  . Major depressive disorder, recurrent episode, severe, with psychosis (HCC) 01/08/2017  . Major depressive disorder, recurrent, severe with psychotic features (HCC) 01/08/2017    Past Surgical History:  Procedure Laterality Date  . TYMPANOSTOMY TUBE PLACEMENT Bilateral 2001    OB History    Gravida Para Term Preterm AB Living   0 0 0 0 0 0   SAB TAB Ectopic Multiple Live Births   0 0 0 0 0       Home Medications    Prior to Admission medications   Medication Sig Start Date End Date Taking? Authorizing Provider    ARIPiprazole (ABILIFY) 5 MG tablet Take 5 mg by mouth daily. 02/14/17  Yes [provider]  citalopram (CELEXA) 10 MG tablet Take 1 tablet (10 mg total) daily by mouth. For depression 01/13/17  Yes Armandina Stammer I, NP  traZODone (DESYREL) 50 MG tablet Take 1 tablet (50 mg) by mouth at bedtime: For sleep 01/12/17  Yes Armandina Stammer I, NP  benztropine (COGENTIN) 0.5 MG tablet Take 1 tablet (0.5 mg total) 2 (two) times daily by mouth. For prevention of drug induced tremors Patient not taking: Reported on 03/03/2017 01/12/17   Armandina Stammer I, NP  hydrOXYzine (ATARAX/VISTARIL) 25 MG tablet Take 1 tablet (25 mg total) every 6 (six) hours as needed by mouth for anxiety. Patient not taking: Reported on 03/03/2017 01/12/17   Armandina Stammer I, NP  risperiDONE (RISPERDAL) 1 MG tablet Take 1 tablet (1 mg total) 2 (two) times daily by mouth. For mood control Patient not taking: Reported on 03/03/2017 01/12/17   Sanjuana Kava, NP    Family History History reviewed. No pertinent family history.  Social History Social History   Tobacco Use  . Smoking status: Former Smoker    Types: Cigars  . Smokeless tobacco: Never Used  Substance Use Topics  . Alcohol use: No    Frequency: Never  . Drug use: Yes    Types: Marijuana  Comment: 1-2 times a day. Last used: 1 hour ago     Allergies   Iodine; Fish allergy; Other; and Shrimp [shellfish allergy]   Review of Systems Review of Systems 10 Systems reviewed and are negative for acute change except as noted in the HPI.   Physical Exam Updated Vital Signs BP 121/66 (BP Location: Left Arm)   Pulse (!) 126   Temp 98.4 F (36.9 C) (Oral)   Resp (!) 22   Ht 5\' 1"  (1.549 m)   Wt 45.4 kg (100 lb)   SpO2 100%   BMI 18.89 kg/m   Physical Exam Patient is alert and nontoxic in appearance.  She is pedaling her legs in the bed.  She is speaking to me but also holding her hand out so she can reach for my hand.  Head face: Normocephalic atraumatic.   Airway widely patent.  Mucous membranes pink and moist. Neck: Supple, no meningismus. Heart: Tachycardic regular no rub murmur gallop.  Peripheral pulses 2+ and symmetric. Respiratory: No respiratory distress.  Bilateral lung fields clear without wheeze rhonchi or rale Abdomen: Soft nontender nondistended. Extremities: No deformities or injuries.  No peripheral edema.  Calves are soft and nontender.  No track marks. Neurologic: Patient is alert and interactive.  She is giving me history in a clear and purposeful voice.  She is showing signs of some agitation with pedaling of her legs and reaching for me however, this then abates when she is distracted with conversation and physical exam.  No tremor. Skin: Warm and dry.  ED Treatments / Results  Labs (all labs ordered are listed, but only abnormal results are displayed) Labs Reviewed  COMPREHENSIVE METABOLIC PANEL - Abnormal; Notable for the following components:      Result Value   Potassium 3.0 (*)    CO2 21 (*)    Glucose, Bld 140 (*)    All other components within normal limits  ACETAMINOPHEN LEVEL - Abnormal; Notable for the following components:   Acetaminophen (Tylenol), Serum <10 (*)    All other components within normal limits  ETHANOL  SALICYLATE LEVEL  CBC  RAPID URINE DRUG SCREEN, HOSP PERFORMED  CBG MONITORING, ED  I-STAT BETA HCG BLOOD, ED ([facility], [facility], [facility] ONLY)    EKG  EKG Interpretation None       Radiology No results found.  Procedures Procedures (including critical care time)  Medications Ordered in ED Medications  potassium chloride SA (K-DUR,KLOR-CON) CR tablet 40 mEq (not administered)  0.9 %  sodium chloride infusion (not administered)  LORazepam (ATIVAN) injection 0.5 mg (0.5 mg Intravenous Given 03/03/17 2229)     Initial Impression / Assessment and Plan / ED Course  I have reviewed the triage vital signs and the nursing notes.  Pertinent labs & imaging results that were available during my care  of the patient were reviewed by me and considered in my medical decision making (see chart for details).    Recheck: Patient's heart rate is now 120s after half a milligram of Ativan and fluids.  She is resting calmly in the bed with no further signs of agitation.  She awakens to light voice.  She is oriented x3.  Final Clinical Impressions(s) / ED Diagnoses   Final diagnoses:  Drug ingestion Tachycardia Behavior disorder Social stressors   Patient presents as outlined above.  At this time, it appears she has ingested other substances besides her reported prescribed Abilify.  Patient showed signs of agitation suggestive of possible stimulant ingestion.  She is tachycardic and agitated but not delirious.  Symptoms are resolving with administration of fluids and Ativan.  She no longer shows signs of physical agitation.  Heart rate is in the 120s.  Plan will be to continue fluid hydration and observation.  Once patient's vital signs have normalized we will plan for TTS consult for drug ingestion of unknown intent and behavior disorder versus psychotic break.  Patient has known history of psychosis and prior psychiatric admissions.  She has significant familial dysfunction per conversation with her uncle. ED Discharge Orders    None       Arby Barrette, MD 03/04/17 0030

## 2017-03-04 NOTE — BHH Counselor (Addendum)
Clinician contacted Kayla Ochoa, Press photographer and noted pt is still sleeping. Clinician expressed TTS consult will be completed during day shift.    Redmond Pulling, MS, [facility], [facility] Triage Specialist [phone]

## 2017-03-04 NOTE — BH Assessment (Signed)
[facility] Assessment Progress Note  Per Laveda Abbe, FNP, this pt does not require psychiatric hospitalization at this time.  Pt is to be discharged from [facility] with recommendation to continue treatment at the [facility].  This has been included in pt's discharge instructions.  Pt's nurse has been notified.  Doylene Canning, MA Triage Specialist [phone]

## 2017-03-04 NOTE — ED Notes (Signed)
VENTOLIN HFA GIVEN TO PT AT DISCHARGE

## 2017-03-04 NOTE — ED Notes (Signed)
Pt awake  Ambulated to bathroom  Tolerated well  Pt states she is hungry  Applesauce given

## 2017-03-07 ENCOUNTER — Encounter (HOSPITAL_COMMUNITY): Payer: Self-pay | Admitting: Emergency Medicine

## 2017-03-07 ENCOUNTER — Emergency Department (HOSPITAL_COMMUNITY)
Admission: EM | Admit: 2017-03-07 | Discharge: 2017-03-07 | Disposition: A | Discharge status: Home / Self Care | Payer: BLUE CROSS/BLUE SHIELD | Attending: Emergency Medicine | Admitting: Emergency Medicine

## 2017-03-07 DIAGNOSIS — Z79899 Other long term (current) drug therapy: Secondary | ICD-10-CM | POA: Diagnosis not present

## 2017-03-07 DIAGNOSIS — F410 Panic disorder [episodic paroxysmal anxiety] without agoraphobia: Secondary | ICD-10-CM | POA: Diagnosis not present

## 2017-03-07 DIAGNOSIS — J45909 Unspecified asthma, uncomplicated: Secondary | ICD-10-CM | POA: Diagnosis not present

## 2017-03-07 DIAGNOSIS — Z87891 Personal history of nicotine dependence: Secondary | ICD-10-CM | POA: Diagnosis not present

## 2017-03-07 DIAGNOSIS — R000 Tachycardia, unspecified: Secondary | ICD-10-CM | POA: Diagnosis present

## 2017-03-07 NOTE — ED Triage Notes (Signed)
Patient here from home with complaints of anxiety attack upon waking this am. Reports hx of same. States that she was ordered to take Abilify and Citalopram with no relief. States that the Abilify causes tremors and "I don't like the way it makes me feel".

## 2017-03-07 NOTE — ED Provider Notes (Signed)
[facility]-EMERGENCY DEPT Provider Note   CSN: [id number] Arrival date & time: 03/07/17  0825     History   Chief Complaint Chief Complaint  Patient presents with  . Anxiety  . Panic Attack    HPI Kayla Ochoa is a 22 y.o. female.  HPI Patient is a 22 year old female presents the emergency department after complaints of a panic attack.  She states she felt racing heart.  She denies fevers and chills.  She is concerned that her psychiatric medications are no longer helping.  She has not called her psychiatric team for additional recommendations.  She feels much better at this time.  No chest pain.  No palpitations.  No syncope or near syncope.  No other complaints.   Past Medical History:  Diagnosis Date  . Anxiety   . Asthma   . Depression   . Uveitis     Patient Active Problem List   Diagnosis Date Noted  . Cannabis use disorder, severe, dependence (HCC) 01/08/2017  . Major depressive disorder, recurrent episode, severe, with psychosis (HCC) 01/08/2017  . Major depressive disorder, recurrent, severe with psychotic features (HCC) 01/08/2017    Past Surgical History:  Procedure Laterality Date  . TYMPANOSTOMY TUBE PLACEMENT Bilateral 2001    OB History    Gravida Para Term Preterm AB Living   0 0 0 0 0 0   SAB TAB Ectopic Multiple Live Births   0 0 0 0 0       Home Medications    Prior to Admission medications   Medication Sig Start Date End Date Taking? Authorizing Provider  albuterol (PROVENTIL HFA;VENTOLIN HFA) 108 (90 Base) MCG/ACT inhaler Inhale 1-2 puffs into the lungs every 6 (six) hours as needed for wheezing or shortness of breath. 03/04/17   Elson Areas, PA-C  ARIPiprazole (ABILIFY) 5 MG tablet Take 5 mg by mouth daily. 02/14/17   [provider]  benztropine (COGENTIN) 0.5 MG tablet Take 1 tablet (0.5 mg total) 2 (two) times daily by mouth. For prevention of drug induced tremors Patient not taking: Reported on  03/03/2017 01/12/17   Armandina Stammer I, NP  citalopram (CELEXA) 10 MG tablet Take 1 tablet (10 mg total) daily by mouth. For depression 01/13/17   Armandina Stammer I, NP  hydrOXYzine (ATARAX/VISTARIL) 25 MG tablet Take 1 tablet (25 mg total) every 6 (six) hours as needed by mouth for anxiety. Patient not taking: Reported on 03/03/2017 01/12/17   Armandina Stammer I, NP  risperiDONE (RISPERDAL) 1 MG tablet Take 1 tablet (1 mg total) 2 (two) times daily by mouth. For mood control Patient not taking: Reported on 03/03/2017 01/12/17   Armandina Stammer I, NP  traZODone (DESYREL) 50 MG tablet Take 1 tablet (50 mg) by mouth at bedtime: For sleep 01/12/17   Sanjuana Kava, NP    Family History No family history on file.  Social History Social History   Tobacco Use  . Smoking status: Former Smoker    Types: Cigars  . Smokeless tobacco: Never Used  Substance Use Topics  . Alcohol use: No    Frequency: Never  . Drug use: Yes    Types: Marijuana    Comment: 1-2 times a day. Last used: 1 hour ago     Allergies   Iodine; Fish allergy; Other; and Shrimp [shellfish allergy]   Review of Systems Review of Systems  All other systems reviewed and are negative.    Physical Exam Updated Vital Signs BP 123/77 (  BP Location: Right Arm)   Pulse (!) 101   Temp 98.3 F (36.8 C) (Oral)   Resp 18   SpO2 99%   Physical Exam  Constitutional: She is oriented to person, place, and time. She appears well-developed and well-nourished.  HENT:  Head: Normocephalic.  Eyes: EOM are normal.  Neck: Normal range of motion.  Pulmonary/Chest: Effort normal.  Abdominal: She exhibits no distension.  Musculoskeletal: Normal range of motion.  Neurological: She is alert and oriented to person, place, and time.  Psychiatric: She has a normal mood and affect.  Nursing note and vitals reviewed.    ED Treatments / Results  Labs (all labs ordered are listed, but only abnormal results are displayed) Labs Reviewed - No data to  display  EKG  EKG Interpretation None       Radiology No results found.  Procedures Procedures (including critical care time)  Medications Ordered in ED Medications - No data to display   Initial Impression / Assessment and Plan / ED Course  I have reviewed the triage vital signs and the nursing notes.  Pertinent labs & imaging results that were available during my care of the patient were reviewed by me and considered in my medical decision making (see chart for details).     Patient feels much better at this time.  No additional recommendations regarding psychiatric medications.  I recommended that she call her primary psychiatric team for medication management.  She feels better.  Close psych follow-up.  She understands return to the ER for new or worsening symptoms  Final Clinical Impressions(s) / ED Diagnoses   Final diagnoses:  Anxiety attack    ED Discharge Orders    None       Azalia Bilis, MD 03/07/17 1007

## 2017-09-02 IMAGING — US US ART/VEN ABD/PELV/SCROTUM DOPPLER LTD
1 series · 13 of 25 positions shown · non-contrast
Comparison: None.

CLINICAL DATA: Pelvic pain.  Clinical concern for ovarian torsion.

EXAM:
TRANSABDOMINAL AND TRANSVAGINAL ULTRASOUND OF PELVIS
DOPPLER ULTRASOUND OF OVARIES
TECHNIQUE: Both transabdominal and transvaginal ultrasound examinations of the
pelvis were performed. Transabdominal technique was performed for
global imaging of the pelvis including uterus, ovaries, adnexal
regions, and pelvic cul-de-sac.
It was necessary to proceed with endovaginal exam following the
transabdominal exam to visualize the endometrium and ovaries. Color
and duplex Doppler ultrasound was utilized to evaluate blood flow to
the ovaries.

[Series 1: us art/ven abd/pelv/scrotum doppler ltd · 0.15mm/px · 13 of 65 slices shown]
[im 1/65]
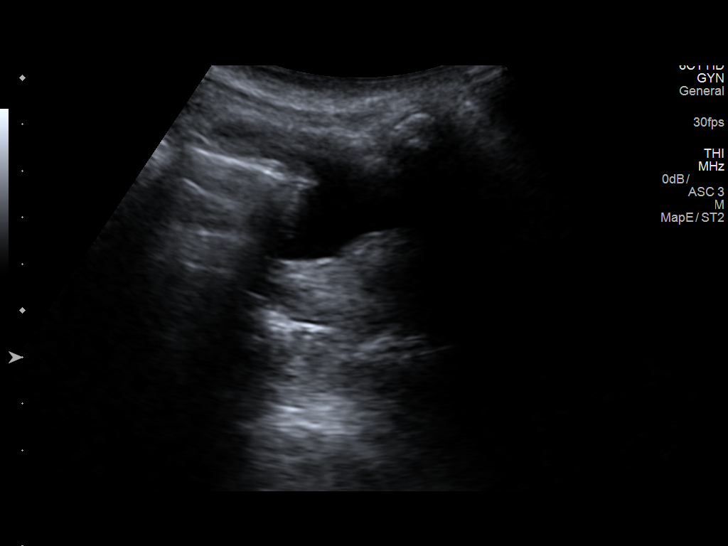
[im 6/65]
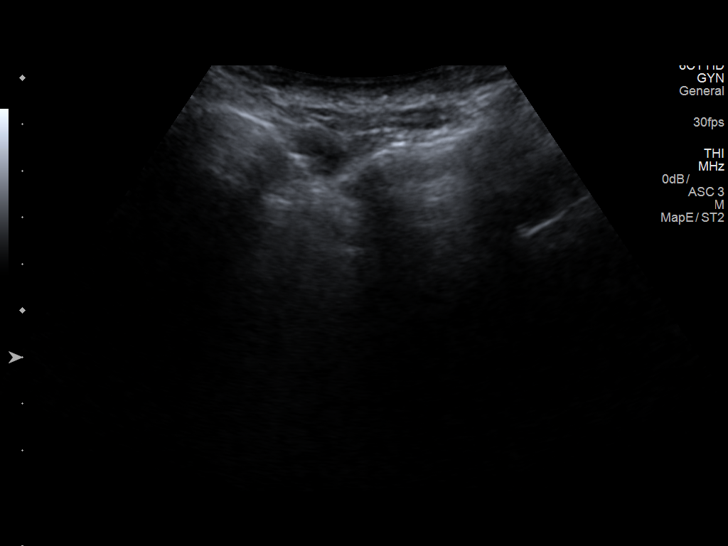
[im 11/65]
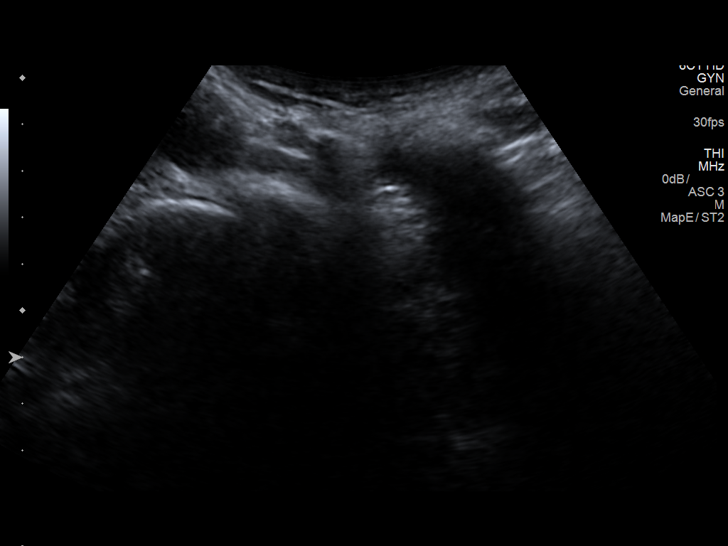
[im 17/65]
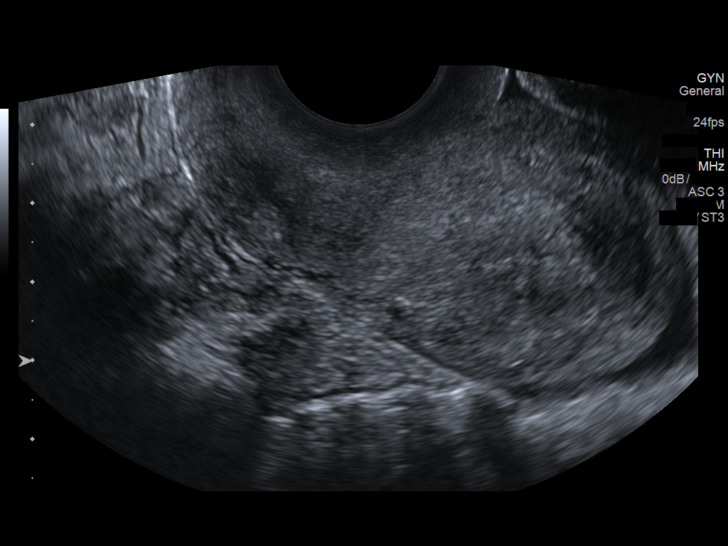
[im 22/65]
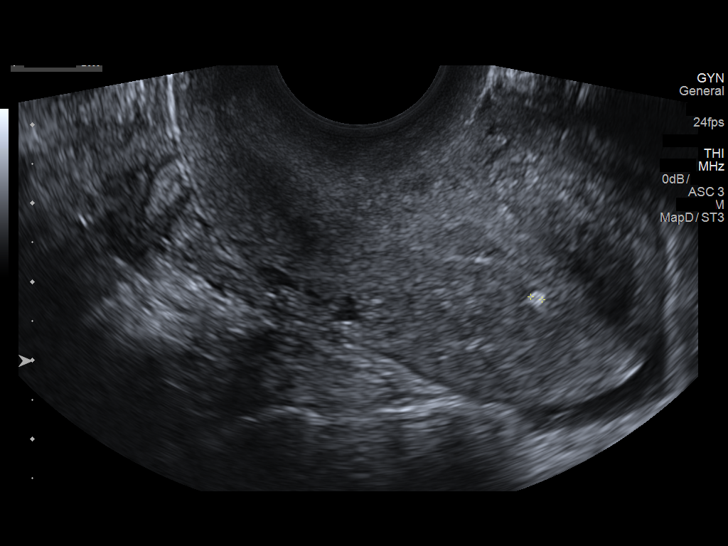
[im 27/65]
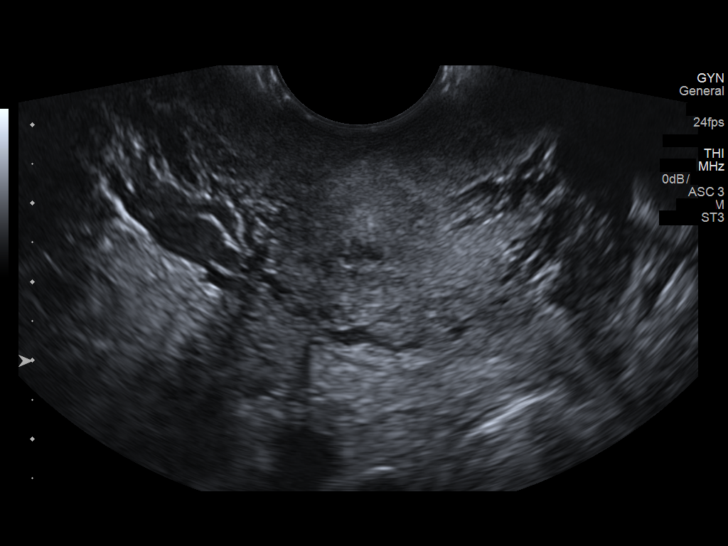
[im 33/65]
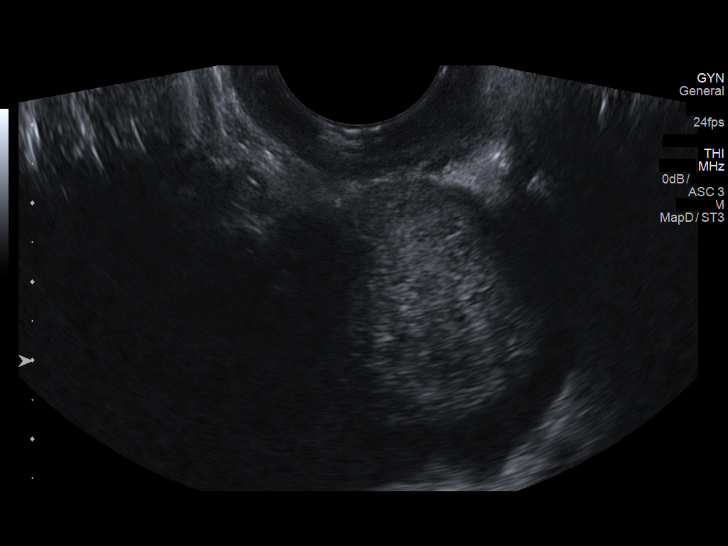
[im 38/65]
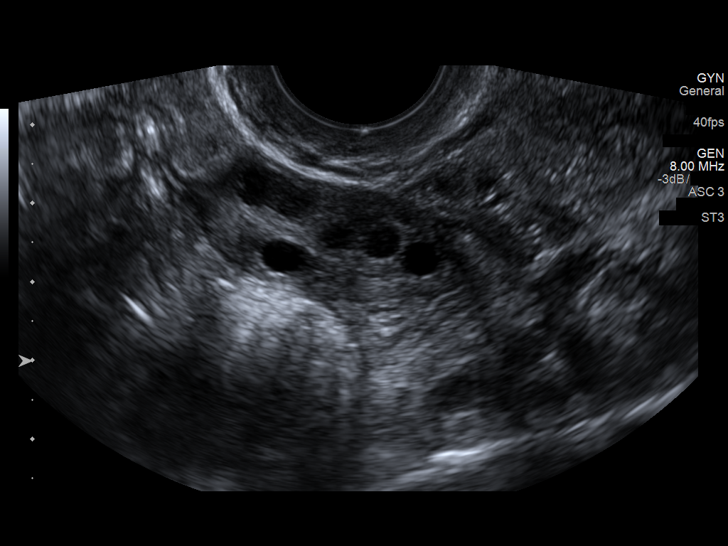
[im 43/65]
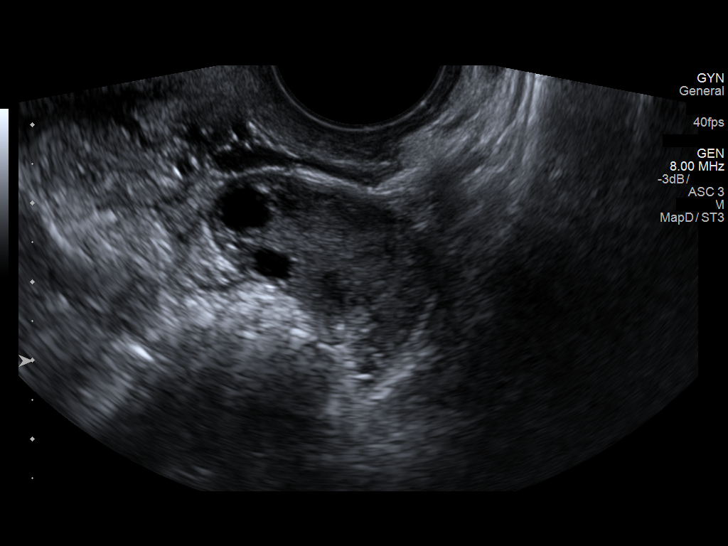
[im 49/65]
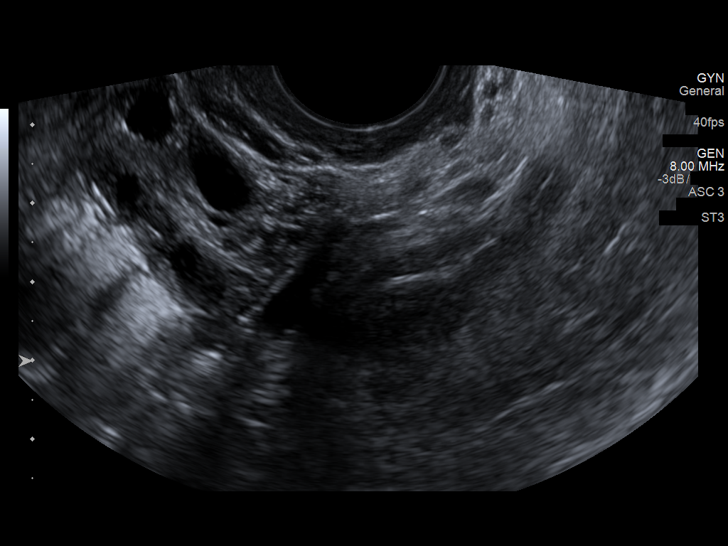
[im 54/65]
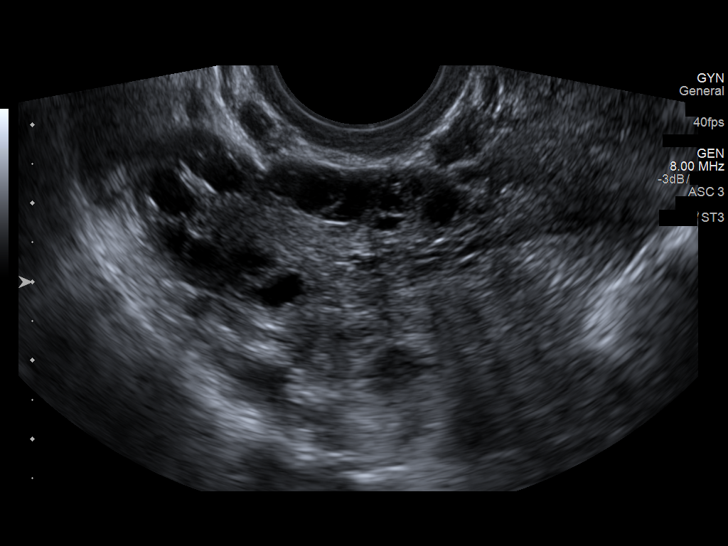
[im 59/65]
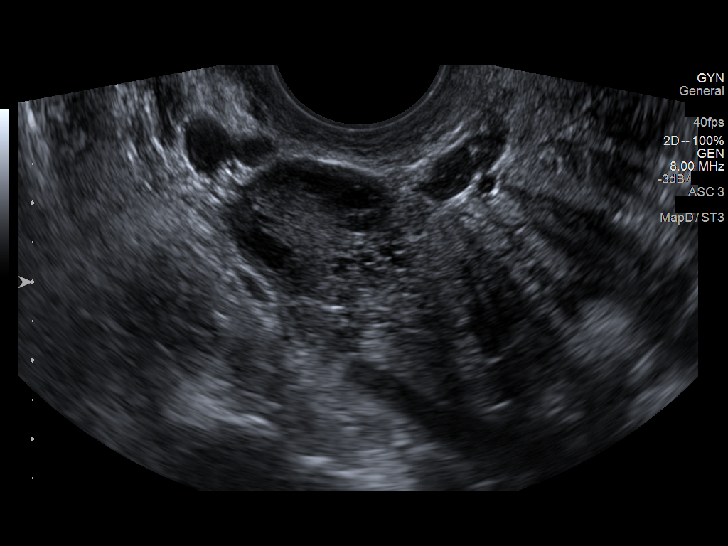
[im 65/65]
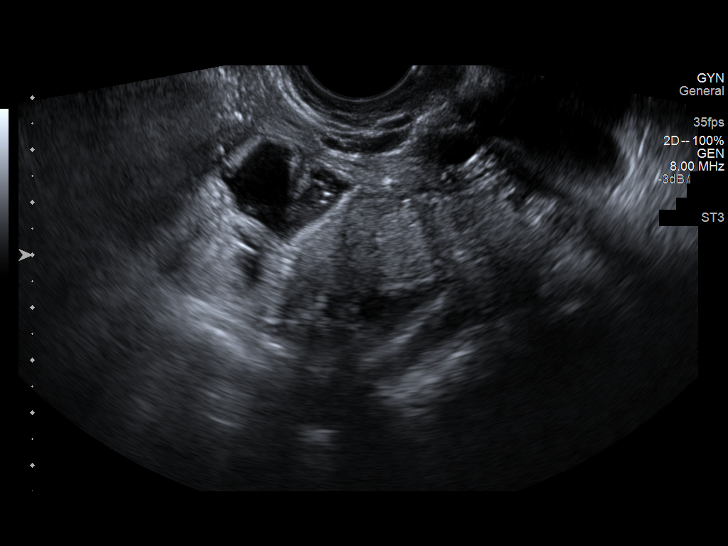

[13 of 25 positions shown; findings below may reference images not displayed]

FINDINGS: Uterus

Measurements: 6.3 x 3.6 x 4.2 cm. Retroverted. No fibroids or other
mass visualized. Tiny echogenic inclusion incidentally noted at the
endometrial-myometrial junction in the anterior fundus.

Endometrium

Thickness: 4 mm.  No focal abnormality visualized.

Right ovary

Measurements: 4.0 x 1.8 x 2.2 cm. Normal appearance/no adnexal mass.

Left ovary

Measurements: 3.9 x 2.1 x 2.3 cm. Normal appearance/no adnexal mass.

Pulsed Doppler evaluation of both ovaries demonstrates normal
low-resistance arterial and venous waveforms.

Other findings

No abnormal free fluid.
IMPRESSION: Normal appearance of uterus and ovaries. No pelvic mass or other
significant abnormality identified.

No sonographic evidence for ovarian torsion.

## 2018-02-25 ENCOUNTER — Encounter (HOSPITAL_COMMUNITY): Payer: Self-pay

## 2018-02-25 ENCOUNTER — Ambulatory Visit (HOSPITAL_COMMUNITY)
Admission: EM | Admit: 2018-02-25 | Discharge: 2018-02-25 | Disposition: A | Discharge status: Home / Self Care | Payer: Medicaid Other | Attending: Family Medicine | Admitting: Family Medicine

## 2018-02-25 DIAGNOSIS — Z3202 Encounter for pregnancy test, result negative: Secondary | ICD-10-CM | POA: Diagnosis not present

## 2018-02-25 DIAGNOSIS — R109 Unspecified abdominal pain: Secondary | ICD-10-CM | POA: Diagnosis not present

## 2018-02-25 DIAGNOSIS — Z87891 Personal history of nicotine dependence: Secondary | ICD-10-CM | POA: Insufficient documentation

## 2018-02-25 DIAGNOSIS — R1030 Lower abdominal pain, unspecified: Secondary | ICD-10-CM | POA: Diagnosis present

## 2018-02-25 DIAGNOSIS — R110 Nausea: Secondary | ICD-10-CM | POA: Diagnosis not present

## 2018-02-25 DIAGNOSIS — N390 Urinary tract infection, site not specified: Secondary | ICD-10-CM | POA: Diagnosis not present

## 2018-02-25 DIAGNOSIS — Z91013 Allergy to seafood: Secondary | ICD-10-CM | POA: Diagnosis not present

## 2018-02-25 LAB — POCT URINALYSIS DIP (DEVICE)
BILIRUBIN URINE: NEGATIVE
Glucose, UA: NEGATIVE mg/dL
KETONES UR: NEGATIVE mg/dL
Nitrite: POSITIVE — AB
Protein, ur: NEGATIVE mg/dL
Urobilinogen, UA: 0.2 mg/dL (ref 0.0–1.0)
pH: 6 (ref 5.0–8.0)

## 2018-02-25 LAB — POCT PREGNANCY, URINE: Preg Test, Ur: NEGATIVE

## 2018-02-25 MED ORDER — NITROFURANTOIN MONOHYD MACRO 100 MG PO CAPS: 100.0000 mg | ORAL_CAPSULE | Freq: Two times a day (BID) | ORAL | 0 refills | Status: DC

## 2018-02-25 NOTE — ED Triage Notes (Signed)
Pt presents with generalized abdominal pain with some nausea.

## 2018-02-25 NOTE — Discharge Instructions (Signed)
Your urine was positive for infection Negative for pregnancy We will go ahead and treat today with antibiotics Macrobid twice a day for 5 days Make sure you are staying hydrated drinking plenty water Follow up as needed for continued or worsening symptoms

## 2018-02-25 NOTE — ED Provider Notes (Signed)
[facility]    CSN: [id number] Arrival date & time: 02/25/18  1626     History   Chief Complaint Chief Complaint  Patient presents with  . Abdominal Pain    HPI Kayla Ochoa is a 23 y.o. female.   Pt is a 23 year old female that presents with lower mid and lower abd pain, nausea since yesterday. Symptoms have been waxing and waning. Se took one of her pain relievers for symptoms. Denies any associated fever, chills, body aches, vaginal discharge or bleeding. She has had an odor. No dysuria or frequency. No vomiting or diarrhea. LBM this am and normal. No LMP recorded. Patient has had an injection. She is currently sexually active with one partner. She has had some acid reflux that started after drinking an alcoholic beverage last night.   ROS per HPI       Past Medical History:  Diagnosis Date  . Anxiety   . Asthma   . Depression   . Uveitis     Patient Active Problem List   Diagnosis Date Noted  . Cannabis use disorder, severe, dependence (HCC) 01/08/2017  . Major depressive disorder, recurrent episode, severe, with psychosis (HCC) 01/08/2017  . Major depressive disorder, recurrent, severe with psychotic features (HCC) 01/08/2017    Past Surgical History:  Procedure Laterality Date  . TYMPANOSTOMY TUBE PLACEMENT Bilateral 2001    OB History    Gravida  0   Para  0   Term  0   Preterm  0   AB  0   Living  0     SAB  0   TAB  0   Ectopic  0   Multiple  0   Live Births  0            Home Medications    Prior to Admission medications   Medication Sig Start Date End Date Taking? Authorizing Provider  albuterol (PROVENTIL HFA;VENTOLIN HFA) 108 (90 Base) MCG/ACT inhaler Inhale 1-2 puffs into the lungs every 6 (six) hours as needed for wheezing or shortness of breath. 03/04/17   Elson AreasSofia, Leslie K, PA-C  ARIPiprazole (ABILIFY) 5 MG tablet Take 5 mg by mouth daily. 02/14/17   [provider]  benztropine (COGENTIN) 0.5 MG  tablet Take 1 tablet (0.5 mg total) 2 (two) times daily by mouth. For prevention of drug induced tremors Patient not taking: Reported on 03/03/2017 01/12/17   Armandina StammerNwoko, Agnes I, NP  citalopram (CELEXA) 10 MG tablet Take 1 tablet (10 mg total) daily by mouth. For depression 01/13/17   Armandina StammerNwoko, Agnes I, NP  hydrOXYzine (ATARAX/VISTARIL) 25 MG tablet Take 1 tablet (25 mg total) every 6 (six) hours as needed by mouth for anxiety. Patient not taking: Reported on 03/03/2017 01/12/17   Armandina StammerNwoko, Agnes I, NP  nitrofurantoin, macrocrystal-monohydrate, (MACROBID) 100 MG capsule Take 1 capsule (100 mg total) by mouth 2 (two) times daily. 02/25/18   Dahlia ByesBast, Takyia Sindt A, NP  risperiDONE (RISPERDAL) 1 MG tablet Take 1 tablet (1 mg total) 2 (two) times daily by mouth. For mood control Patient not taking: Reported on 03/03/2017 01/12/17   Armandina StammerNwoko, Agnes I, NP  traZODone (DESYREL) 50 MG tablet Take 1 tablet (50 mg) by mouth at bedtime: For sleep 01/12/17   Sanjuana KavaNwoko, Agnes I, NP    Family History History reviewed. No pertinent family history.  Social History Social History   Tobacco Use  . Smoking status: Former Smoker    Types: Cigars  . Smokeless tobacco:  Never Used  Substance Use Topics  . Alcohol use: No    Frequency: Never  . Drug use: Yes    Types: Marijuana    Comment: 1-2 times a day. Last used: 1 hour ago     Allergies   Iodine; Fish allergy; Other; and Shrimp [shellfish allergy]   Review of Systems Review of Systems   Physical Exam Triage Vital Signs ED Triage Vitals  Enc Vitals Group     BP 02/25/18 1811 119/69     Pulse Rate 02/25/18 1811 97     Resp 02/25/18 1811 20     Temp 02/25/18 1811 98.2 F (36.8 C)     Temp Source 02/25/18 1811 Oral     SpO2 02/25/18 1811 100 %     Weight --      Height --      Head Circumference --      Peak Flow --      Pain Score 02/25/18 1813 3     Pain Loc --      Pain Edu? --      Excl. in GC? --    No data found.  Updated Vital Signs BP 119/69 (BP Location:  Right Arm)   Pulse 97   Temp 98.2 F (36.8 C) (Oral)   Resp 20   SpO2 100%   Visual Acuity Right Eye Distance:   Left Eye Distance:   Bilateral Distance:    Right Eye Near:   Left Eye Near:    Bilateral Near:     Physical Exam Vitals signs and nursing note reviewed.  Constitutional:      General: She is not in acute distress.    Appearance: She is well-developed. She is not ill-appearing, toxic-appearing or diaphoretic.  HENT:     Head: Normocephalic and atraumatic.  Pulmonary:     Effort: Pulmonary effort is normal.  Abdominal:     General: Bowel sounds are normal.     Palpations: Abdomen is soft.     Tenderness: There is abdominal tenderness in the suprapubic area. There is no right CVA tenderness, left CVA tenderness, guarding or rebound. Negative signs include Murphy's sign.     Hernia: No hernia is present.  Genitourinary:    Comments: Deferred  Skin:    General: Skin is warm and dry.  Neurological:     Mental Status: She is alert.  Psychiatric:        Mood and Affect: Mood normal.      UC Treatments / Results  Labs (all labs ordered are listed, but only abnormal results are displayed) Labs Reviewed  POCT URINALYSIS DIP (DEVICE) - Abnormal; Notable for the following components:      Result Value   Hgb urine dipstick SMALL (*)    Nitrite POSITIVE (*)    Leukocytes, UA TRACE (*)    All other components within normal limits  URINE CULTURE  POC URINE PREG, ED  POCT PREGNANCY, URINE  URINE CYTOLOGY ANCILLARY ONLY    EKG None  Radiology No results found.  Procedures Procedures (including critical care time)  Medications Ordered in UC Medications - No data to display  Initial Impression / Assessment and Plan / UC Course  I have reviewed the triage vital signs and the nursing notes.  Pertinent labs & imaging results that were available during my care of the patient were reviewed by me and considered in my medical decision making (see chart for  details).    UTI  Urine  positive for infection Negative for pregnancy  Will treat and send for culture.  Also sending for cytology Labs pending  Final Clinical Impressions(s) / UC Diagnoses   Final diagnoses:  Lower urinary tract infectious disease     Discharge Instructions     Your urine was positive for infection Negative for pregnancy We will go ahead and treat today with antibiotics Macrobid twice a day for 5 days Make sure you are staying hydrated drinking plenty water Follow up as needed for continued or worsening symptoms    ED Prescriptions    Medication Sig Dispense Auth. Provider   nitrofurantoin, macrocrystal-monohydrate, (MACROBID) 100 MG capsule Take 1 capsule (100 mg total) by mouth 2 (two) times daily. 10 capsule Dahlia Byes A, NP     Controlled Substance Prescriptions [place] Controlled Substance Registry consulted? Not Applicable   Janace Aris, NP 02/25/18 1916

## 2018-02-26 LAB — URINE CYTOLOGY ANCILLARY ONLY
CHLAMYDIA, DNA PROBE: NEGATIVE
NEISSERIA GONORRHEA: NEGATIVE
Trichomonas: NEGATIVE

## 2018-02-28 LAB — URINE CULTURE

## 2018-03-01 LAB — URINE CYTOLOGY ANCILLARY ONLY: Candida vaginitis: NEGATIVE

## 2018-03-02 ENCOUNTER — Telehealth (HOSPITAL_COMMUNITY): Payer: Self-pay | Admitting: Emergency Medicine

## 2018-03-02 MED ORDER — METRONIDAZOLE 0.75 % VA GEL: 1.0000 | Freq: Every day | VAGINAL | 0 refills | Status: AC

## 2018-03-02 NOTE — Telephone Encounter (Signed)
Urine culture was positive for ESCHERICHIA COLI and was given MACROBID at urgent care visit. Pt contacted and made aware, educated on completing antibiotic and to follow up if symptoms are persistent. Verbalized understanding.   Bacterial vaginosis is positive. This was not treated at the urgent care visit. Pt requesting metrogel, sent to preferred pharmacy.   All questions answered.

## 2018-04-08 ENCOUNTER — Encounter: Payer: Self-pay | Admitting: Obstetrics & Gynecology

## 2018-04-08 ENCOUNTER — Other Ambulatory Visit: Payer: Self-pay

## 2018-04-08 ENCOUNTER — Ambulatory Visit (INDEPENDENT_AMBULATORY_CARE_PROVIDER_SITE_OTHER): Payer: BLUE CROSS/BLUE SHIELD | Admitting: Obstetrics & Gynecology

## 2018-04-08 VITALS — BP 100/60 | HR 88 | Resp 16 | Ht 61.0 in | Wt 115.0 lb

## 2018-04-08 DIAGNOSIS — N898 Other specified noninflammatory disorders of vagina: Secondary | ICD-10-CM

## 2018-04-08 DIAGNOSIS — M0800 Unspecified juvenile rheumatoid arthritis of unspecified site: Secondary | ICD-10-CM | POA: Diagnosis not present

## 2018-04-08 DIAGNOSIS — N926 Irregular menstruation, unspecified: Secondary | ICD-10-CM

## 2018-04-08 MED ORDER — CETIRIZINE HCL 10 MG PO TABS: 10.0000 mg | ORAL_TABLET | Freq: Every day | ORAL | 5 refills | Status: DC

## 2018-04-08 MED ORDER — EPINEPHRINE 0.3 MG/0.3ML IJ SOAJ: 0.3000 mg | INTRAMUSCULAR | 1 refills | Status: DC | PRN

## 2018-04-08 MED ORDER — TINIDAZOLE 500 MG PO TABS: ORAL_TABLET | ORAL | 0 refills | Status: DC

## 2018-04-08 NOTE — Progress Notes (Signed)
23 y.o. G0P0000 Single Black or [place] female here for new patient exam.  She's been on depo provera since October.  She just got the second injection of the Depo Provera.  Third is scheudled for 06/08/2018 with Pecola Leisureeese.  She had a nexplanon prior to that but had it removed early due to irregular bleeding and associated mood swings.    She is SA.  She's had three partners since October.    Most recent cycle started 03/07/2018.  Flow is not heavy but bleeding lasted almost the last month.  Now she's having vaginal odor that is fishy but "spoiled" smelling.    H/o uveitis.  Has appt with Dr. Annabelle Harmanejiv Shah, Ophthalmology, for chronic eye issues.  Has been almost three years since she saw him.  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 Diamantina ProvidenceJulisa Patel, pediatric rheumatology but was advised to transition to adult provider.  Did not do this.  Does need referral.     PCP:  Leilani AbleBetti Reese, MD  Patient's last menstrual period was 03/07/2018.          Sexually active: Yes.    The current method of family planning is Depo Provera injection. Last one 03/09/18 Exercising: Yes.     Smoker:  no  Health Maintenance: Pap:  never TDaP:  ~ 2017  Gardasil: completed  Screening Labs: PCP   reports that she has quit smoking. Her smoking use included cigars. She has never used smokeless tobacco. She reports previous drug use. Drugs: Marijuana and Other-see comments. She reports that she does not drink alcohol.  Past Medical History:  Diagnosis Date  . Anxiety   . Asthma   . Depression   . Uveitis     Past Surgical History:  Procedure Laterality Date  . TYMPANOSTOMY TUBE PLACEMENT Bilateral 2001    Current Outpatient Medications  Medication Sig Dispense Refill  . albuterol (PROVENTIL HFA;VENTOLIN HFA) 108 (90 Base) MCG/ACT inhaler Inhale 1-2 puffs into the lungs every 6 (six) hours as needed for wheezing or shortness of breath. 1 Inhaler 0  . ibuprofen (ADVIL,MOTRIN) 800 MG tablet daily as needed.    .  medroxyPROGESTERone Acetate 150 MG/ML SUSY Last one 03/09/18    . naproxen (NAPROSYN) 500 MG tablet daily as needed.     No current facility-administered medications for this visit.     Family History  Problem Relation Age of Onset  . Breast cancer Maternal Aunt   . Diabetes Paternal Aunt   . Schizophrenia Maternal Grandmother   . Diabetes Maternal Grandfather     Review of Systems  All other systems reviewed and are negative.   Exam:   BP 100/60 (BP Location: Left Arm, Patient Position: Sitting, Cuff Size: Normal)   Pulse 88   Resp 16   Ht 5\' 1"  (1.549 m)   Wt 115 lb (52.2 kg)   LMP 03/07/2018   BMI 21.73 kg/m     Height: 5\' 1"  (154.9 cm)  Ht Readings from Last 3 Encounters:  04/08/18 5\' 1"  (1.549 m)  03/03/17 5\' 1"  (1.549 m) (10 %, Z= -1.29)*  01/07/17 5\' 1"  (1.549 m) (10 %, Z= -1.29)*   * Growth percentiles are based on CDC (Girls, 2-20 Years) data.    General appearance: alert, cooperative and appears stated age Abdomen: soft, non-tender; bowel sounds normal; no masses,  no organomegaly Extremities: extremities normal, atraumatic, no cyanosis or edema Skin: Skin color, texture, turgor normal. No rashes or lesions Lymph nodes: Cervical, supraclavicular, and axillary  nodes normal. No abnormal inguinal nodes palpated Neurologic: Grossly normal   Pelvic: External genitalia:  no lesions              Urethra:  normal appearing urethra with no masses, tenderness or lesions              Bartholins and Skenes: normal                 Vagina: normal appearing vagina with normal color and discharge, no lesions, some bleeding with odor is noted              Cervix: no lesions              Pap taken: No. Bimanual Exam:  Uterus:  normal size, contour, position, consistency, mobility, non-tender              Adnexa: normal adnexa and no mass, fullness, tenderness               Anus:  no lesions  Chaperone was present for exam.  A:  Vaginal odor Irregular bleeding H/O  juvenile RA.  Has not seen provider in 3 years.    P:   Vaginitis testing obtained today. Referral to Rheumatology made today Rx for Epi-pen Rx for zyrtec 10mg  daily to pt.  She is aware that I think this is OTC but rx given to pt Vaginitis testing pending Tindamax 1gram po x 5 days.   May need early depo provera injection to stop bleeding if vaginitis testing is negative.

## 2018-04-09 ENCOUNTER — Telehealth: Payer: Self-pay | Admitting: Obstetrics & Gynecology

## 2018-04-09 MED ORDER — METRONIDAZOLE 500 MG PO TABS: 500.0000 mg | ORAL_TABLET | Freq: Two times a day (BID) | ORAL | 0 refills | Status: AC

## 2018-04-09 NOTE — Telephone Encounter (Signed)
Patient would like to speak with nurse about her medication. °

## 2018-04-09 NOTE — Telephone Encounter (Signed)
Returned call to patient. Message given to patient as seen below from Dr. Hyacinth Meeker. Patient agreeable to try flagyl. Instructions on use and ETOH precautions reviewed with patient and she verbalized understanding. Prescription for flagyl 500mg , #14, 0RF sent to confirmed pharmacy on file. Advised patient vaginitis testing not back yet, but would be updated once back. Patient agreeable.

## 2018-04-09 NOTE — Telephone Encounter (Signed)
She wanted vaginal treatment but I felt the oral medication would be better.  She did not want oral flagyl because of the after-taste.  If she is ok with this, ok to send in Rx for flagyl 500mg  bid x 7 days.  Vaginitis swab results are not back.

## 2018-04-09 NOTE — Telephone Encounter (Signed)
Call to patient. Patient states she went to pick up tinidazole prescription and pharmacist told her Medicaid was "blocking it." States she also has BCBS and they are wanting her to pay $45 for the medication and states "I'm not trying to do that." Patient states pharmacist told her to contact her MD's office. RN advised would review with Dr. Hyacinth Meeker and return call. Patient agreeable.   Routing to provider for review.

## 2018-04-10 LAB — NUSWAB VAGINITIS PLUS (VG+)
ATOPOBIUM VAGINAE: HIGH {score} — AB
BVAB 2: HIGH Score — AB
CANDIDA ALBICANS, NAA: NEGATIVE
CANDIDA GLABRATA, NAA: NEGATIVE
Chlamydia trachomatis, NAA: NEGATIVE
Megasphaera 1: HIGH Score — AB
Neisseria gonorrhoeae, NAA: NEGATIVE
Trich vag by NAA: NEGATIVE

## 2018-04-12 ENCOUNTER — Telehealth: Payer: Self-pay | Admitting: *Deleted

## 2018-04-12 NOTE — Telephone Encounter (Signed)
LM for pt to call back.

## 2018-04-12 NOTE — Telephone Encounter (Signed)
-----   Message from Jerene Bears, MD sent at 04/12/2018 11:49 AM EST ----- Please let pt know her vaginitis testing was negative for GC, Chl, and trich as well as yeast.  It did show BV.  She was treated with flagyl 500mg  bid x 7days and needs to let me know if the odor does not resolve.  Thanks.

## 2018-04-12 NOTE — Telephone Encounter (Signed)
Pt notified.  Verbalized understanding.

## 2018-04-28 ENCOUNTER — Telehealth: Payer: Self-pay | Admitting: Obstetrics & Gynecology

## 2018-04-28 NOTE — Telephone Encounter (Signed)
Call placed in reference to a referral for Rheumatology requesting previous records.

## 2018-05-05 ENCOUNTER — Encounter: Payer: Self-pay | Admitting: Obstetrics & Gynecology

## 2018-05-06 ENCOUNTER — Encounter: Payer: Self-pay | Admitting: Obstetrics & Gynecology

## 2018-05-06 NOTE — Telephone Encounter (Signed)
No response from patient in regards to Rheumatology referral. Letter written, reviewed and approved by Dr. Hyacinth Meeker. Mailed letter 05/06/18.

## 2018-05-17 ENCOUNTER — Telehealth: Payer: Self-pay | Admitting: Obstetrics & Gynecology

## 2018-05-17 NOTE — Telephone Encounter (Signed)
Message left to return call to [place] at [phone].   Treated for BV by Dr. Hyacinth Meeker on 04/08/2018.

## 2018-05-17 NOTE — Telephone Encounter (Signed)
Patient has a yeast infection. °

## 2018-05-18 ENCOUNTER — Encounter: Payer: Self-pay | Admitting: Obstetrics & Gynecology

## 2018-05-18 ENCOUNTER — Other Ambulatory Visit: Payer: Self-pay

## 2018-05-18 ENCOUNTER — Ambulatory Visit (INDEPENDENT_AMBULATORY_CARE_PROVIDER_SITE_OTHER): Payer: BLUE CROSS/BLUE SHIELD | Admitting: Obstetrics & Gynecology

## 2018-05-18 VITALS — BP 122/60 | HR 88 | Temp 99.1°F | Resp 16 | Ht 61.0 in | Wt 117.0 lb

## 2018-05-18 DIAGNOSIS — Z3042 Encounter for surveillance of injectable contraceptive: Secondary | ICD-10-CM

## 2018-05-18 DIAGNOSIS — Z0000 Encounter for general adult medical examination without abnormal findings: Secondary | ICD-10-CM | POA: Diagnosis not present

## 2018-05-18 DIAGNOSIS — N898 Other specified noninflammatory disorders of vagina: Secondary | ICD-10-CM

## 2018-05-18 DIAGNOSIS — Z202 Contact with and (suspected) exposure to infections with a predominantly sexual mode of transmission: Secondary | ICD-10-CM | POA: Diagnosis not present

## 2018-05-18 LAB — POCT URINE PREGNANCY: PREG TEST UR: NEGATIVE

## 2018-05-18 MED ORDER — FLUCONAZOLE 150 MG PO TABS: ORAL_TABLET | ORAL | 0 refills | Status: DC

## 2018-05-18 MED ORDER — TERCONAZOLE 0.4 % VA CREA: TOPICAL_CREAM | VAGINAL | 0 refills | Status: DC

## 2018-05-18 MED ORDER — MEDROXYPROGESTERONE ACETATE 150 MG/ML IM SUSP
150.0000 mg | Freq: Once | INTRAMUSCULAR | Status: AC
  Administered 2018-05-18: 150 mg via INTRAMUSCULAR

## 2018-05-18 NOTE — Addendum Note (Signed)
Addended by: Loreta Ave on: 05/18/2018 02:02 PM   Modules accepted: Orders

## 2018-05-18 NOTE — Telephone Encounter (Signed)
Spoke with patient. Patient requesting OV ASAP for yeast. Reports vaginal itching, symptoms started 5 days ago. Completed 3 day monistat on 3/23, no relief. Patient is on her menses now, "unable to tell if any vaginal d/c or odor". Denies fever/chills, or upper respiratory symptoms.   OV scheduled for today at 1130 with Dr. Hyacinth Meeker.    Encounter closed.

## 2018-05-18 NOTE — Progress Notes (Signed)
GYNECOLOGY  VISIT  CC:   Vaginitis symptoms.   HPI: 23 y.o. G0P0000 Single Black or African American female here for itching, groin rash, soreness, irritation x 5 days.  She tried 3 day monistat treatment on 05/15/18 and has used the external cream that went with the OTC prescription.    Reports she is currently on day 7 of her cycle.  The next Depo injection is due 06/08/18.  She is receiving this at Dr. Haskel Schroedereese's office.  Last SA was 05/08/18.  Desires STD testing today.  Has broken up with boyfriend.  GYNECOLOGIC HISTORY: Patient's last menstrual period was 05/11/2018. Contraception: Depo - last 03/09/18 Menopausal hormone therapy: none  Patient Active Problem List   Diagnosis Date Noted  . Cannabis use disorder, severe, dependence (HCC) 01/08/2017  . Major depressive disorder, recurrent episode, severe, with psychosis (HCC) 01/08/2017  . Major depressive disorder, recurrent, severe with psychotic features (HCC) 01/08/2017    Past Medical History:  Diagnosis Date  . Anxiety   . Asthma   . Depression   . Juvenile rheumatoid arthritis (HCC)   . Uveitis     Past Surgical History:  Procedure Laterality Date  . TYMPANOSTOMY TUBE PLACEMENT Bilateral 2001    MEDS:   Current Outpatient Medications on File Prior to Visit  Medication Sig Dispense Refill  . cetirizine (ZYRTEC) 10 MG tablet Take 1 tablet (10 mg total) by mouth daily. 30 tablet 5  . ibuprofen (ADVIL,MOTRIN) 800 MG tablet daily as needed.    . medroxyPROGESTERone Acetate 150 MG/ML SUSY Last one 03/09/18    . albuterol (PROVENTIL HFA;VENTOLIN HFA) 108 (90 Base) MCG/ACT inhaler Inhale 1-2 puffs into the lungs every 6 (six) hours as needed for wheezing or shortness of breath. (Patient not taking: Reported on 05/18/2018) 1 Inhaler 0  . EPINEPHrine 0.3 mg/0.3 mL IJ SOAJ injection Inject 0.3 mLs (0.3 mg total) into the muscle as needed for anaphylaxis. (Patient not taking: Reported on 05/18/2018) 1 Device 1   No current  facility-administered medications on file prior to visit.     ALLERGIES: Iodine; Fish allergy; Other; and Shrimp [shellfish allergy]  Family History  Problem Relation Age of Onset  . Breast cancer Maternal Aunt   . Diabetes Paternal Aunt   . Schizophrenia Maternal Grandmother   . Diabetes Maternal Grandfather     SH:  Single, non smoker  Review of Systems  Constitutional:       Weight gain   Gastrointestinal: Positive for abdominal distention and abdominal pain.  Endocrine: Positive for cold intolerance and heat intolerance.       Craving sweets   Genitourinary: Positive for vaginal bleeding.       Night urination Vaginal itching  Loss of sexual interest   Musculoskeletal: Positive for myalgias.  Skin: Positive for rash.       Itching   Psychiatric/Behavioral: The patient is nervous/anxious.        Depression   All other systems reviewed and are negative.   PHYSICAL EXAMINATION:    BP 122/60   Pulse 88   Temp 99.1 F (37.3 C) (Oral)   Resp 16   Ht 5\' 1"  (1.549 m)   Wt 117 lb (53.1 kg)   LMP 05/11/2018   BMI 22.11 kg/m     General appearance: alert, cooperative and appears stated age Abdomen: soft, non-tender; bowel sounds normal; no masses,  no organomegaly Lymph:  no inguinal LAD noted  Pelvic: External genitalia:  no lesions  Urethra:  normal appearing urethra with no masses, tenderness or lesions              Bartholins and Skenes: normal                 Vagina: normal appearing vagina with normal color and discharge, no lesions              Cervix: no lesions              Bimanual Exam:  Uterus:  normal size, contour, position, consistency, mobility, non-tender              Adnexa: no mass, fullness, tenderness  Chaperone was present for exam.  Assessment: Vaginal itching has used OTC monistat x 3 days Prolonged menstrual cycle STD exposure Juvenile RA  Plan: HIV, RPR, Hep B and C testing obtained Depo 150mg  IM x 1 given now.  Repeat  in 13 weeks. Vaginitis testing obtained Rx for Terazol 7 to use externally Diflucan 150mg  po x 1, repeat 72 hours.

## 2018-05-18 NOTE — Progress Notes (Signed)
Patient is here for Depo Provera Injection Patient is within Depo Provera Calender Limits: yes, Last depo 03/09/18.  Next Depo Due between: 6/9 - 6/23 Last AEX: 04/08/18 ? AEX Scheduled: none  Patient is aware when next depo is due  Pt tolerated Injection well. Given by Fulton Mole, RN

## 2018-05-19 LAB — HEP, RPR, HIV PANEL
HEP B S AG: NEGATIVE
HIV Screen 4th Generation wRfx: NONREACTIVE
RPR Ser Ql: NONREACTIVE

## 2018-05-19 LAB — HEPATITIS C ANTIBODY: Hep C Virus Ab: <0.1 s/co ratio (ref 0.0–0.9)

## 2018-05-24 ENCOUNTER — Telehealth: Payer: Self-pay | Admitting: Obstetrics & Gynecology

## 2018-05-24 LAB — NUSWAB VAGINITIS PLUS (VG+)
Candida albicans, NAA: NEGATIVE
Candida glabrata, NAA: NEGATIVE
Chlamydia trachomatis, NAA: NEGATIVE
Neisseria gonorrhoeae, NAA: NEGATIVE
Trich vag by NAA: NEGATIVE

## 2018-05-24 NOTE — Telephone Encounter (Signed)
UPT in office on 05-18-18 was negative.

## 2018-05-24 NOTE — Telephone Encounter (Signed)
Spoke with patient. Seen in office on 05/18/18, has completed medication for vaginitis symptoms, symptoms have resolved, Nuswab test still pending. Received depo-provera while in office, 2wks early. Menses started on 05/11/18, changing non-saturated pad 1-2x/day, bleeding is intermittent. Reports pelvic pain after intercourse, 7/10. Taking naproxen prn, brings pain to 2/10. Same partner, denies any STD concerns. Denies N/V, dizziness, lightheadedness, fatigue. Patient states she has not experienced cycles lasting this long with depo-provera, asking if there is anything she can take to stop the bleeding?   Advised irregular bleeding not uncommon with depo-provera. Advised to continue to monitor bleeding, if bleeding becomes heavy, changing saturated pad q1-2hrs or severe pain develops, return call to office. Advised covering provider will review, our office will return call if any additional recommendations. Patient agreeable.   Routing to Dr. Edward Jolly to review.   Cc: Dr. Hyacinth Meeker

## 2018-05-24 NOTE — Telephone Encounter (Signed)
I agree with the precautions you have given the patient.  I would recommend she do a pregnancy test and let us know the result.  If spotting persists, she can make an appointment to be seen.

## 2018-05-24 NOTE — Telephone Encounter (Signed)
Patient states her bleeding stopped 2 days after her last Depo shot but states she has had intercourse and is now bleeding again. Would like to speak with nurse and advise on how to stop the bleeding.

## 2018-05-24 NOTE — Progress Notes (Signed)
Patient requested UPT prior to Depo injection. UPT in office negative.

## 2018-05-24 NOTE — Telephone Encounter (Signed)
Spoke with patient. Advised as seen below per Dr. Edward Jolly. Patient states UPT in office on 05/18/18 was negative, confirmed with Kandis Cocking, RN. Patient verbalizes understanding and is agreeable.   Routing to provider for final review. Patient is agreeable to disposition. Will close encounter.  Cc: Billie Ruddy, RN

## 2018-05-25 NOTE — Addendum Note (Signed)
Addended by: Loreta Ave on: 05/25/2018 10:08 AM   Modules accepted: Orders

## 2018-06-03 ENCOUNTER — Telehealth: Payer: Self-pay | Admitting: Obstetrics & Gynecology

## 2018-06-03 NOTE — Telephone Encounter (Signed)
Patient is returning a call to a triage nurse.

## 2018-06-04 NOTE — Telephone Encounter (Signed)
Spoke with patient.  This is a new issue for her, she feels that during intercourse she is feeling that her vaginal muscles are not "tightening" as much as they have been previously. No pain during intercourse. Feels very embarrassed to talk about this with her partner and unsure what could be happening. No trauma or loss of bowel or bladder control.   Offered office visit.  No cold or flu symptoms, no sick contacts. Office visit with Dr. Oscar La 06/08/2018. Call back with any changes before appointment. Pt agreeable.  Encounter to Dr. Oscar La and will close.

## 2018-06-07 ENCOUNTER — Other Ambulatory Visit: Payer: Self-pay

## 2018-06-08 ENCOUNTER — Other Ambulatory Visit: Payer: Self-pay

## 2018-06-08 ENCOUNTER — Encounter: Payer: Self-pay | Admitting: Obstetrics and Gynecology

## 2018-06-08 ENCOUNTER — Ambulatory Visit (INDEPENDENT_AMBULATORY_CARE_PROVIDER_SITE_OTHER): Payer: BLUE CROSS/BLUE SHIELD | Admitting: Obstetrics and Gynecology

## 2018-06-08 VITALS — BP 104/64 | HR 64 | Temp 98.1°F | Resp 16 | Wt 117.0 lb

## 2018-06-08 DIAGNOSIS — Z709 Sex counseling, unspecified: Secondary | ICD-10-CM | POA: Diagnosis not present

## 2018-06-08 NOTE — Patient Instructions (Signed)
Kegel Exercises  Kegel exercises help strengthen the muscles that support the rectum, vagina, small intestine, bladder, and uterus. Doing Kegel exercises can help:   Improve bladder and bowel control.   Improve sexual response.   Reduce problems and discomfort during pregnancy.  Kegel exercises involve squeezing your pelvic floor muscles, which are the same muscles you squeeze when you try to stop the flow of urine. The exercises can be done while sitting, standing, or lying down, but it is best to vary your position.  Exercises  1. Squeeze your pelvic floor muscles tight. You should feel a tight lift in your rectal area. If you are a female, you should also feel a tightness in your vaginal area. Keep your stomach, buttocks, and legs relaxed.  2. Hold the muscles tight for up to 10 seconds.  3. Relax your muscles.  Repeat this exercise 50 times a day or as many times as told by your health care provider. Continue to do this exercise for at least 4-6 weeks or for as long as told by your health care provider.  This information is not intended to replace advice given to you by your health care provider. Make sure you discuss any questions you have with your health care provider.  Document Released: 01/28/2012 Document Revised: 06/23/2016 Document Reviewed: 12/31/2014  [employer]vier Interactive Patient Education  2019 [employer]

## 2018-06-08 NOTE — Progress Notes (Signed)
GYNECOLOGY  VISIT   HPI: 23 y.o.   Single Black or African American Not Hispanic or Latino  female   G0P0000 with Patient's last menstrual period was 05/11/2018. here for vaginal concern.  Her partner told her her vagina felt looser last time they had sex, she couldn't feel his penis very well that time either. She was very lubricated (no external lubrication). They have been together x 5 months.  She has been on depo-provera since the fall.  No increase vaginal d/c, no itching, burning, irritation or odor.  Negative STD testing within the last month.   GYNECOLOGIC HISTORY: Patient's last menstrual period was 05/11/2018. Contraception:depo provera Menopausal hormone therapy: none        OB History    Gravida  0   Para  0   Term  0   Preterm  0   AB  0   Living  0     SAB  0   TAB  0   Ectopic  0   Multiple  0   Live Births  0              Patient Active Problem List   Diagnosis Date Noted  . Cannabis use disorder, severe, dependence (HCC) 01/08/2017  . Major depressive disorder, recurrent episode, severe, with psychosis (HCC) 01/08/2017  . Major depressive disorder, recurrent, severe with psychotic features (HCC) 01/08/2017  . 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 Anxiety   . Asthma   . Depression   . Juvenile rheumatoid arthritis (HCC)   . Uveitis     Past Surgical History:  Procedure Laterality Date  . TYMPANOSTOMY TUBE PLACEMENT Bilateral 2001    Current Outpatient Medications  Medication Sig Dispense Refill  . albuterol (PROVENTIL HFA;VENTOLIN HFA) 108 (90 Base) MCG/ACT inhaler Inhale 1-2 puffs into the lungs every 6 (six) hours as needed for wheezing or shortness of breath. (Patient not taking: Reported on 05/18/2018) 1 Inhaler 0  . cetirizine (ZYRTEC) 10 MG tablet Take 1 tablet (10 mg total) by mouth daily. 30 tablet 5  . EPINEPHrine 0.3 mg/0.3 mL IJ SOAJ injection Inject 0.3 mLs (0.3 mg  total) into the muscle as needed for anaphylaxis. (Patient not taking: Reported on 05/18/2018) 1 Device 1  . fluconazole (DIFLUCAN) 150 MG tablet Take 1 tablet and repeat in 72 hours 2 tablet 0  . ibuprofen (ADVIL,MOTRIN) 800 MG tablet daily as needed.    . medroxyPROGESTERone Acetate 150 MG/ML SUSY Last one 03/09/18    . terconazole (TERAZOL 7) 0.4 % vaginal cream 1 applicator full nightly x 7 days or use externally every 6 hours as needed. 45 g 0   No current facility-administered medications for this visit.      ALLERGIES: Iodine; Fish allergy; Other; and Shrimp [shellfish allergy]  Family History  Problem Relation Age of Onset  . Breast cancer Maternal Aunt   . Diabetes Paternal Aunt   . Schizophrenia Maternal Grandmother   . Diabetes Maternal Grandfather     Social History   Socioeconomic History  . Marital status: Single    Spouse name: Not on file  . Number of children: Not on file  . Years of education: Not on file  . Highest education level: Not on file  Occupational History  . Not on file  Social Needs  . Financial resource strain: Not on file  . Food insecurity:    Worry: Not on file  Inability: Not on file  . Transportation needs:    Medical: Not on file    Non-medical: Not on file  Tobacco Use  . Smoking status: Former Smoker    Types: Cigars  . Smokeless tobacco: Never Used  Substance and Sexual Activity  . Alcohol use: No    Frequency: Never  . Drug use: Not Currently    Types: Marijuana, Other-see comments    Comment: CBD  . Sexual activity: Yes    Birth control/protection: Injection  Lifestyle  . Physical activity:    Days per week: Not on file    Minutes per session: Not on file  . Stress: Not on file  Relationships  . Social connections:    Talks on phone: Not on file    Gets together: Not on file    Attends religious service: Not on file    Active member of club or organization: Not on file    Attends meetings of clubs or organizations:  Not on file    Relationship status: Not on file  . Intimate partner violence:    Fear of current or ex partner: Not on file    Emotionally abused: Not on file    Physically abused: Not on file    Forced sexual activity: Not on file  Other Topics Concern  . Not on file  Social History Narrative  . Not on file    Review of Systems  Constitutional: Negative.   HENT: Negative.   Eyes: Negative.   Respiratory: Negative.   Cardiovascular: Negative.   Gastrointestinal: Negative.   Genitourinary:       Loss of vaginal tightness  Musculoskeletal: Negative.   Skin: Negative.   Neurological: Negative.   Endo/Heme/Allergies: Negative.   Psychiatric/Behavioral: Negative.     PHYSICAL EXAMINATION:    LMP 05/11/2018     General appearance: alert, cooperative and appears stated age  Pelvic: External genitalia:  no lesions              Urethra:  normal appearing urethra with no masses, tenderness or lesions              Bartholins and Skenes: normal                 Vagina: normal appearing vagina with normal color and discharge, no lesions. Normal vaginal caliber. Kegel strength is 2/5              Cervix: no cervical motion tenderness and no lesions              Bimanual Exam:  Uterus:  normal size, contour, position, consistency, mobility, non-tender              Adnexa: no mass, fullness, tenderness                Chaperone was present for exam.  ASSESSMENT Patient reassured that her vagina is normal, not too "loose" Being very lubricated can decrease the friction    PLAN Reassured that she is normal Kegel information given to help her keep her pelvic floor strong    An After Visit Summary was printed and given to the patient.

## 2018-06-16 ENCOUNTER — Ambulatory Visit (INDEPENDENT_AMBULATORY_CARE_PROVIDER_SITE_OTHER): Payer: BC Managed Care – PPO | Admitting: Obstetrics and Gynecology

## 2018-06-16 ENCOUNTER — Encounter: Payer: Self-pay | Admitting: Obstetrics and Gynecology

## 2018-06-16 ENCOUNTER — Telehealth: Payer: Self-pay | Admitting: Obstetrics and Gynecology

## 2018-06-16 ENCOUNTER — Other Ambulatory Visit: Payer: Self-pay

## 2018-06-16 VITALS — BP 110/72 | HR 74 | Temp 99.1°F | Resp 16 | Wt 114.0 lb

## 2018-06-16 DIAGNOSIS — B9689 Other specified bacterial agents as the cause of diseases classified elsewhere: Secondary | ICD-10-CM

## 2018-06-16 DIAGNOSIS — Z113 Encounter for screening for infections with a predominantly sexual mode of transmission: Secondary | ICD-10-CM

## 2018-06-16 DIAGNOSIS — N760 Acute vaginitis: Secondary | ICD-10-CM

## 2018-06-16 DIAGNOSIS — B3731 Acute candidiasis of vulva and vagina: Secondary | ICD-10-CM

## 2018-06-16 DIAGNOSIS — B373 Candidiasis of vulva and vagina: Secondary | ICD-10-CM

## 2018-06-16 MED ORDER — FLUCONAZOLE 150 MG PO TABS: 150.0000 mg | ORAL_TABLET | Freq: Once | ORAL | 0 refills | Status: AC

## 2018-06-16 MED ORDER — BETAMETHASONE VALERATE 0.1 % EX OINT: 1.0000 "application " | TOPICAL_OINTMENT | Freq: Two times a day (BID) | CUTANEOUS | 0 refills | Status: DC

## 2018-06-16 MED ORDER — METRONIDAZOLE 500 MG PO TABS: 500.0000 mg | ORAL_TABLET | Freq: Two times a day (BID) | ORAL | 0 refills | Status: DC

## 2018-06-16 NOTE — Telephone Encounter (Signed)
Spoke with patient. Reports vaginal discomfort, odor and itching. Feels like "sores in vagina", unable to visualize. Has "concerns about partner", states she has an "issue" every time she has intercourse. Denies vaginal d/c. States she has recently used terazol 7 with no change in symptoms. Recommended OV for further evaluation. OV scheduled for tpday at 1pm with Dr. Oscar La. Covid screening negative.   Patient verbalizes understanding and is agreeable. Encounter closed.

## 2018-06-16 NOTE — Progress Notes (Signed)
GYNECOLOGY  VISIT   HPI: 23 y.o.   Single Black or African American Not Hispanic or Latino  female   G0P0000 with No LMP recorded. Patient has had an injection.   here for vaginitis. She c/o a 3-4 day h/o vaginal itching, irritation. No increase in vaginal d/c. She used some Terazol cream starting 3 days ago (had left over). No odor. Hurts to wipe, feels like a sore. She is sexually active, worried he is cheating. Negative STD testing last month.   GYNECOLOGIC HISTORY: No LMP recorded. Patient has had an injection. Contraception: depo provera Menopausal hormone therapy: none        OB History    Gravida  0   Para  0   Term  0   Preterm  0   AB  0   Living  0     SAB  0   TAB  0   Ectopic  0   Multiple  0   Live Births  0              Patient Active Problem List   Diagnosis Date Noted  . Cannabis use disorder, severe, dependence (HCC) 01/08/2017  . Major depressive disorder, recurrent episode, severe, with psychosis (HCC) 01/08/2017  . Major depressive disorder, recurrent, severe with psychotic features (HCC) 01/08/2017  . 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 Anxiety   . Asthma   . Depression   . Juvenile rheumatoid arthritis (HCC)   . Uveitis     Past Surgical History:  Procedure Laterality Date  . TYMPANOSTOMY TUBE PLACEMENT Bilateral 2001    Current Outpatient Medications  Medication Sig Dispense Refill  . albuterol (PROVENTIL HFA;VENTOLIN HFA) 108 (90 Base) MCG/ACT inhaler Inhale 1-2 puffs into the lungs every 6 (six) hours as needed for wheezing or shortness of breath. 1 Inhaler 0  . cetirizine (ZYRTEC) 10 MG tablet Take 1 tablet (10 mg total) by mouth daily. 30 tablet 5  . ibuprofen (ADVIL,MOTRIN) 800 MG tablet daily as needed.    . medroxyPROGESTERone Acetate 150 MG/ML SUSY Last one 03/09/18    . EPINEPHrine 0.3 mg/0.3 mL IJ SOAJ injection Inject 0.3 mLs (0.3 mg total) into the muscle as  needed for anaphylaxis. (Patient not taking: Reported on 05/18/2018) 1 Device 1   No current facility-administered medications for this visit.      ALLERGIES: Iodine; Fish allergy; Other; and Shrimp [shellfish allergy]  Family History  Problem Relation Age of Onset  . Breast cancer Maternal Aunt   . Diabetes Paternal Aunt   . Schizophrenia Maternal Grandmother   . Diabetes Maternal Grandfather     Social History   Socioeconomic History  . Marital status: Single    Spouse name: Not on file  . Number of children: Not on file  . Years of education: Not on file  . Highest education level: Not on file  Occupational History  . Not on file  Social Needs  . Financial resource strain: Not on file  . Food insecurity:    Worry: Not on file    Inability: Not on file  . Transportation needs:    Medical: Not on file    Non-medical: Not on file  Tobacco Use  . Smoking status: Former Smoker    Types: Cigars  . Smokeless tobacco: Never Used  Substance and Sexual Activity  . Alcohol use: No    Frequency: Never  . Drug use: Not  on file    Comment: CBD  . Sexual activity: Yes    Partners: Male    Birth control/protection: Injection    Comment: depo  Lifestyle  . Physical activity:    Days per week: Not on file    Minutes per session: Not on file  . Stress: Not on file  Relationships  . Social connections:    Talks on phone: Not on file    Gets together: Not on file    Attends religious service: Not on file    Active member of club or organization: Not on file    Attends meetings of clubs or organizations: Not on file    Relationship status: Not on file  . Intimate partner violence:    Fear of current or ex partner: Not on file    Emotionally abused: Not on file    Physically abused: Not on file    Forced sexual activity: Not on file  Other Topics Concern  . Not on file  Social History Narrative  . Not on file    Review of Systems  Constitutional: Negative.   HENT:  Negative.   Eyes: Negative.   Respiratory: Negative.   Cardiovascular: Negative.   Gastrointestinal: Positive for nausea.  Musculoskeletal: Positive for myalgias.  Skin: Positive for itching.  Psychiatric/Behavioral: Positive for depression. The patient is nervous/anxious.     PHYSICAL EXAMINATION:    BP 110/72   Pulse 74   Temp 99.1 F (37.3 C) (Oral)   Resp 16   Wt 114 lb (51.7 kg)   BMI 21.54 kg/m     General appearance: alert, cooperative and appears stated age   Pelvic: External genitalia:  Erythematous, several small fissures              Urethra:  normal appearing urethra with no masses, tenderness or lesions              Bartholins and Skenes: normal                 Vagina:erythematous vagina with an increase in thin              Cervix: no cervical motion tenderness and no lesions              Bimanual Exam:  Uterus:  normal size, contour, position, consistency, mobility, non-tender              Adnexa: no mass, fullness, tenderness               Chaperone was present for exam.  ASSESSMENT Yeast vaginitis BV Screening STD    PLAN Treat with diflucan, flagyl and steroid ointment STD testing   An After Visit Summary was printed and given to the patient.

## 2018-06-16 NOTE — Telephone Encounter (Signed)
Patient is asking for another refill of her "yeast infection " cream. Patient states she was just treated last week for this yeast infection. [facility], [address]

## 2018-06-16 NOTE — Patient Instructions (Signed)
Vaginal Yeast infection, Adult    Vaginal yeast infection is a condition that causes vaginal discharge as well as soreness, swelling, and redness (inflammation) of the vagina. This is a common condition. Some women get this infection frequently.  What are the causes?  This condition is caused by a change in the normal balance of the yeast (candida) and bacteria that live in the vagina. This change causes an overgrowth of yeast, which causes the inflammation.  What increases the risk?  The condition is more likely to develop in women who:   Take antibiotic medicines.   Have diabetes.   Take birth control pills.   Are pregnant.   Douche often.   Have a weak body defense system (immune system).   Have been taking steroid medicines for a long time.   Frequently wear tight clothing.  What are the signs or symptoms?  Symptoms of this condition include:   White, thick, creamy vaginal discharge.   Swelling, itching, redness, and irritation of the vagina. The lips of the vagina (vulva) may be affected as well.   Pain or a burning feeling while urinating.   Pain during sex.  How is this diagnosed?  This condition is diagnosed based on:   Your medical history.   A physical exam.   A pelvic exam. Your health care provider will examine a sample of your vaginal discharge under a microscope. Your health care provider may send this sample for testing to confirm the diagnosis.  How is this treated?  This condition is treated with medicine. Medicines may be over-the-counter or prescription. You may be told to use one or more of the following:   Medicine that is taken by mouth (orally).   Medicine that is applied as a cream (topically).   Medicine that is inserted directly into the vagina (suppository).  Follow these instructions at home:    Lifestyle   Do not have sex until your health care provider approves. Tell your sex partner that you have a yeast infection. That person should go to his or her health care  provider and ask if they should also be treated.   Do not wear tight clothes, such as pantyhose or tight pants.   Wear breathable cotton underwear.  General instructions   Take or apply over-the-counter and prescription medicines only as told by your health care provider.   Eat more yogurt. This may help to keep your yeast infection from returning.   Do not use tampons until your health care provider approves.   Try taking a sitz bath to help with discomfort. This is a warm water bath that is taken while you are sitting down. The water should only come up to your hips and should cover your buttocks. Do this 3-4 times per day or as told by your health care provider.   Do not douche.   If you have diabetes, keep your blood sugar levels under control.   Keep all follow-up visits as told by your health care provider. This is important.  Contact a health care provider if:   You have a fever.   Your symptoms go away and then return.   Your symptoms do not get better with treatment.   Your symptoms get worse.   You have new symptoms.   You develop blisters in or around your vagina.   You have blood coming from your vagina and it is not your menstrual period.   You develop pain in your abdomen.  Summary     Vaginal yeast infection is a condition that causes discharge as well as soreness, swelling, and redness (inflammation) of the vagina.   This condition is treated with medicine. Medicines may be over-the-counter or prescription.   Take or apply over-the-counter and prescription medicines only as told by your health care provider.   Do not douche. Do not have sex or use tampons until your health care provider approves.   Contact a health care provider if your symptoms do not get better with treatment or your symptoms go away and then return.  This information is not intended to replace advice given to you by your health care provider. Make sure you discuss any questions you have with your health care  provider.  Document Released: 11/20/2004 Document Revised: 06/29/2017 Document Reviewed: 06/29/2017  Elsevier Interactive Patient Education  2019 [employer]  Vaginitis  Vaginitis is a condition in which the vaginal tissue swells and becomes red (inflamed). This condition is most often caused by a change in the normal balance of bacteria and yeast that live in the vagina. This change causes an overgrowth of certain bacteria or yeast, which causes the inflammation. There are different types of vaginitis, but the most common types are:   Bacterial vaginosis.   Yeast infection (candidiasis).   Trichomoniasis vaginitis. This is a sexually transmitted disease (STD).   Viral vaginitis.   Atrophic vaginitis.   Allergic vaginitis.  What are the causes?  The cause of this condition depends on the type of vaginitis. It can be caused by:   Bacteria (bacterial vaginosis).   Yeast, which is a fungus (yeast infection).   A parasite (trichomoniasis vaginitis).   A virus (viral vaginitis).   Low hormone levels (atrophic vaginitis). Low hormone levels can occur during pregnancy, breastfeeding, or after menopause.   Irritants, such as bubble baths, scented tampons, and feminine sprays (allergic vaginitis).  Other factors can change the normal balance of the yeast and bacteria that live in the vagina. These include:   Antibiotic medicines.   Poor hygiene.   Diaphragms, vaginal sponges, spermicides, birth control pills, and intrauterine devices (IUD).   Sex.   Infection.   Uncontrolled diabetes.   A weakened defense (immune) system.  What increases the risk?  This condition is more likely to develop in women who:   Smoke.   Use vaginal douches, scented tampons, or scented sanitary pads.   Wear tight-fitting pants.   Wear thong underwear.   Use oral birth control pills or an IUD.   Have sex without a condom.   Have multiple sex partners.   Have an STD.   Frequently use the spermicide nonoxynol-9.   Eat  lots of foods high in sugar.   Have uncontrolled diabetes.   Have low estrogen levels.   Have a weakened immune system from an immune disorder or medical treatment.   Are pregnant or breastfeeding.  What are the signs or symptoms?  Symptoms vary depending on the cause of the vaginitis. Common symptoms include:   Abnormal vaginal discharge.  ? The discharge is white, gray, or yellow with bacterial vaginosis.  ? The discharge is thick, white, and cheesy with a yeast infection.  ? The discharge is frothy and yellow or greenish with trichomoniasis.   A bad vaginal smell. The smell is fishy with bacterial vaginosis.   Vaginal itching, pain, or swelling.   Sex that is painful.   Pain or burning when urinating.  Sometimes there are no symptoms.  How is this diagnosed?    This condition is diagnosed based on your symptoms and medical history. A physical exam, including a pelvic exam, will also be done. You may also have other tests, including:   Tests to determine the pH level (acidity or alkalinity) of your vagina.   A whiff test, to assess the odor that results when a sample of your vaginal discharge is mixed with a potassium hydroxide solution.   Tests of vaginal fluid. A sample will be examined under a microscope.  How is this treated?  Treatment varies depending on the type of vaginitis you have. Your treatment may include:   Antibiotic creams or pills to treat bacterial vaginosis and trichomoniasis.   Antifungal medicines, such as vaginal creams or suppositories, to treat a yeast infection.   Medicine to ease discomfort if you have viral vaginitis. Your sexual partner should also be treated.   Estrogen delivered in a cream, pill, suppository, or vaginal ring to treat atrophic vaginitis. If vaginal dryness occurs, lubricants and moisturizing creams may help. You may need to avoid scented soaps, sprays, or douches.   Stopping use of a product that is causing allergic vaginitis. Then using a vaginal cream  to treat the symptoms.  Follow these instructions at home:  Lifestyle   Keep your genital area clean and dry. Avoid soap, and only rinse the area with water.   Do not douche or use tampons until your health care provider says it is okay to do so. Use sanitary pads, if needed.   Do not have sex until your health care provider approves. When you can return to sex, practice safe sex and use condoms.   Wipe from front to back. This avoids the spread of bacteria from the rectum to the vagina.  General instructions   Take over-the-counter and prescription medicines only as told by your health care provider.   If you were prescribed an antibiotic medicine, take or use it as told by your health care provider. Do not stop taking or using the antibiotic even if you start to feel better.   Keep all follow-up visits as told by your health care provider. This is important.  How is this prevented?   Use mild, non-scented products. Do not use things that can irritate the vagina, such as fabric softeners. Avoid the following products if they are scented:  ? Feminine sprays.  ? Detergents.  ? Tampons.  ? Feminine hygiene products.  ? Soaps or bubble baths.   Let air reach your genital area.  ? Wear cotton underwear to reduce moisture buildup.  ? Avoid wearing underwear while you sleep.  ? Avoid wearing tight pants and underwear or nylons without a cotton panel.  ? Avoid wearing thong underwear.   Take off any wet clothing, such as bathing suits, as soon as possible.   Practice safe sex and use condoms.  Contact a health care provider if:   You have abdominal pain.   You have a fever.   You have symptoms that last for more than 2-3 days.  Get help right away if:   You have a fever and your symptoms suddenly get worse.  Summary   Vaginitis is a condition in which the vaginal tissue becomes inflamed.This condition is most often caused by a change in the normal balance of bacteria and yeast that live in the  vagina.   Treatment varies depending on the type of vaginitis you have.   Do not douche, use tampons , or have sex until your   health care provider approves. When you can return to sex, practice safe sex and use condoms.  This information is not intended to replace advice given to you by your health care provider. Make sure you discuss any questions you have with your health care provider.  Document Released: 12/08/2006 Document Revised: 03/18/2016 Document Reviewed: 03/18/2016  Elsevier Interactive Patient Education  2019 [employer] Inc[employer]

## 2018-06-17 LAB — HIV ANTIBODY (ROUTINE TESTING W REFLEX): HIV Screen 4th Generation wRfx: NONREACTIVE

## 2018-06-17 LAB — RPR: RPR Ser Ql: NONREACTIVE

## 2018-06-18 LAB — CHLAMYDIA/GONOCOCCUS/TRICHOMONAS, NAA
Chlamydia by NAA: NEGATIVE
Gonococcus by NAA: NEGATIVE
Trich vag by NAA: NEGATIVE

## 2018-06-25 ENCOUNTER — Telehealth: Payer: Self-pay | Admitting: Obstetrics and Gynecology

## 2018-06-25 MED ORDER — METRONIDAZOLE 0.75 % VA GEL: 1.0000 | Freq: Every day | VAGINAL | 0 refills | Status: DC

## 2018-06-25 NOTE — Telephone Encounter (Signed)
Spoke with patient. Patient states that she completed treatment for yeast infection. Has been trying to take Flagyl 500 mg BID for BV, but has forgotten many doses. Reports when she does take it she has severe nausea and GI upset. Has taken 8 tablets out of 14. Denies any current symptoms. Patient is concerned not completing dose will cause BV to return. Asking if she needs to take another medication or if she is okay to stop the Flagyl and not treat with anything additional.

## 2018-06-25 NOTE — Telephone Encounter (Signed)
Spoke with patient. Advised of message as seen below from Dr.Jertson. Patient verbalizes understanding. Rx for Metrogel 1 applicator qhs x 5 nights 0RF sent to pharmacy on file. Patient verbalizes understanding. Encounter closed.

## 2018-06-25 NOTE — Telephone Encounter (Signed)
I would have her use metrogel, 1 applicator qhs x 5 days. This is her second episode of BV in a few months.

## 2018-06-25 NOTE — Telephone Encounter (Signed)
Patient has questions about her recent prescription.

## 2018-07-30 ENCOUNTER — Other Ambulatory Visit: Payer: Self-pay

## 2018-08-02 NOTE — Progress Notes (Signed)
Patient is here for Depo Provera Injection Patient is within Depo Provera Calender Limits yes, last given 05-18-2018 Next Depo Due between: 8/25-9/8 Last AEX: 04-08-2018 SM  AEX Scheduled: not currently scheduled   Patient is aware when next depo is due.  Patient request UPT prior to injection which was negative. Patient also states bleeding has improved since last injection. Spotted for 2-3 days starting 07-27-2018.   Pt tolerated Injection well in right deltoid.  Routed to provider for review, encounter closed.

## 2018-08-03 ENCOUNTER — Encounter: Payer: Self-pay | Admitting: Obstetrics and Gynecology

## 2018-08-03 ENCOUNTER — Ambulatory Visit (INDEPENDENT_AMBULATORY_CARE_PROVIDER_SITE_OTHER): Payer: BC Managed Care – PPO | Admitting: *Deleted

## 2018-08-03 ENCOUNTER — Other Ambulatory Visit: Payer: Self-pay

## 2018-08-03 VITALS — BP 110/66 | HR 78 | Temp 98.1°F | Resp 14 | Ht 61.0 in | Wt 119.2 lb

## 2018-08-03 DIAGNOSIS — Z3042 Encounter for surveillance of injectable contraceptive: Secondary | ICD-10-CM

## 2018-08-03 LAB — POCT URINE PREGNANCY: Preg Test, Ur: NEGATIVE

## 2018-08-03 MED ORDER — MEDROXYPROGESTERONE ACETATE 150 MG/ML IM SUSP
150.0000 mg | Freq: Once | INTRAMUSCULAR | Status: AC
  Administered 2018-08-03: 150 mg via INTRAMUSCULAR

## 2018-10-13 ENCOUNTER — Telehealth: Payer: Self-pay | Admitting: Obstetrics & Gynecology

## 2018-10-13 NOTE — Telephone Encounter (Signed)
Call to patient in reference to Rheumatology referral. Left message requesting records for referral. Per Dr. Trudie Reed office patient has not responded to request for additional records. Referral has been place on hold until records are received and reviewed.

## 2018-10-20 ENCOUNTER — Ambulatory Visit: Payer: BC Managed Care – PPO

## 2018-10-25 ENCOUNTER — Ambulatory Visit (INDEPENDENT_AMBULATORY_CARE_PROVIDER_SITE_OTHER): Payer: BC Managed Care – PPO

## 2018-10-25 ENCOUNTER — Other Ambulatory Visit: Payer: Self-pay

## 2018-10-25 VITALS — BP 108/78 | HR 76 | Temp 97.3°F | Ht 61.0 in | Wt 121.2 lb

## 2018-10-25 DIAGNOSIS — Z3042 Encounter for surveillance of injectable contraceptive: Secondary | ICD-10-CM

## 2018-10-25 MED ORDER — MEDROXYPROGESTERONE ACETATE 150 MG/ML IM SUSP
150.0000 mg | Freq: Once | INTRAMUSCULAR | Status: AC
  Administered 2018-10-25: 150 mg via INTRAMUSCULAR

## 2018-10-25 NOTE — Progress Notes (Signed)
Patient is here for Depo Provera Injection Patient is within Depo Provera Calender Limits 10/19/2018 - 11/02/2018 Next Depo Due between: 01/10/2019 - 01/24/2019 Last AEX: 06/16/2018 AEX Scheduled: none  Patient is aware when next depo is due  Pt tolerated Injection well.  Routed to provider for review, encounter closed.

## 2018-10-26 ENCOUNTER — Other Ambulatory Visit: Payer: Self-pay

## 2018-10-26 ENCOUNTER — Encounter: Payer: Self-pay | Admitting: Obstetrics and Gynecology

## 2018-10-26 ENCOUNTER — Ambulatory Visit (INDEPENDENT_AMBULATORY_CARE_PROVIDER_SITE_OTHER): Payer: BC Managed Care – PPO | Admitting: Obstetrics and Gynecology

## 2018-10-26 VITALS — BP 118/60 | HR 76 | Temp 98.4°F | Wt 119.4 lb

## 2018-10-26 DIAGNOSIS — N766 Ulceration of vulva: Secondary | ICD-10-CM | POA: Diagnosis not present

## 2018-10-26 DIAGNOSIS — Z113 Encounter for screening for infections with a predominantly sexual mode of transmission: Secondary | ICD-10-CM | POA: Diagnosis not present

## 2018-10-26 DIAGNOSIS — B9689 Other specified bacterial agents as the cause of diseases classified elsewhere: Secondary | ICD-10-CM

## 2018-10-26 DIAGNOSIS — N760 Acute vaginitis: Secondary | ICD-10-CM

## 2018-10-26 MED ORDER — METRONIDAZOLE 0.75 % VA GEL: 1.0000 | Freq: Every day | VAGINAL | 0 refills | Status: DC

## 2018-10-26 MED ORDER — VALACYCLOVIR HCL 500 MG PO TABS: ORAL_TABLET | ORAL | 1 refills | Status: DC

## 2018-10-26 NOTE — Progress Notes (Signed)
GYNECOLOGY  VISIT   HPI: 23 y.o.   Single Black or African American Not Hispanic or Latino  female   [id number] with Patient's last menstrual period was 10/19/2018 (approximate).   here for vaginal itching that began last night. Slight itching, no burning or irritation. The itching is focal on her clitoris, she has had this same issue in the past in the same place.  No increase vaginal discharge. Wants STD testing. She has had 2 partners this year. Broke up with her last partner in a month ago. He cheated on her.   GYNECOLOGIC HISTORY: Patient's last menstrual period was 10/19/2018 (approximate). Contraception: Depo Provera injection Menopausal hormone therapy: None        OB History    Gravida  0   Para  0   Term  0   Preterm  0   AB  0   Living  0     SAB  0   TAB  0   Ectopic  0   Multiple  0   Live Births  0              Patient Active Problem List   Diagnosis Date Noted  . Cannabis use disorder, severe, dependence (HCC) 01/08/2017  . Major depressive disorder, recurrent episode, severe, with psychosis (HCC) 01/08/2017  . Major depressive disorder, recurrent, severe with psychotic features (HCC) 01/08/2017  . 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 Anxiety   . Asthma   . Depression   . Juvenile rheumatoid arthritis (HCC)   . Uveitis     Past Surgical History:  Procedure Laterality Date  . TYMPANOSTOMY TUBE PLACEMENT Bilateral 2001    Current Outpatient Medications  Medication Sig Dispense Refill  . albuterol (PROVENTIL HFA;VENTOLIN HFA) 108 (90 Base) MCG/ACT inhaler Inhale 1-2 puffs into the lungs every 6 (six) hours as needed for wheezing or shortness of breath. 1 Inhaler 0  . cetirizine (ZYRTEC) 10 MG tablet Take 1 tablet (10 mg total) by mouth daily. 30 tablet 5  . EPINEPHrine 0.3 mg/0.3 mL IJ SOAJ injection Inject 0.3 mLs (0.3 mg total) into the muscle as needed for anaphylaxis. 1 Device 1  .  ibuprofen (ADVIL,MOTRIN) 800 MG tablet daily as needed.    . medroxyPROGESTERone Acetate 150 MG/ML SUSY Last one 03/09/18     No current facility-administered medications for this visit.      ALLERGIES: Iodine, Fish allergy, Other, and Shrimp [shellfish allergy]  Family History  Problem Relation Age of Onset  . Breast cancer Maternal Aunt   . Diabetes Paternal Aunt   . Schizophrenia Maternal Grandmother   . Diabetes Maternal Grandfather     Social History   Socioeconomic History  . Marital status: Single    Spouse name: Not on file  . Number of children: Not on file  . Years of education: Not on file  . Highest education level: Not on file  Occupational History  . Not on file  Social Needs  . Financial resource strain: Not on file  . Food insecurity    Worry: Not on file    Inability: Not on file  . Transportation needs    Medical: Not on file    Non-medical: Not on file  Tobacco Use  . Smoking status: Former Smoker    Types: Cigars  . Smokeless tobacco: Never Used  Substance and Sexual Activity  . Alcohol use: No    Frequency: Never  .  Drug use: Not on file    Comment: CBD  . Sexual activity: Yes    Partners: Male    Birth control/protection: Injection    Comment: depo  Lifestyle  . Physical activity    Days per week: Not on file    Minutes per session: Not on file  . Stress: Not on file  Relationships  . Social Herbalist on phone: Not on file    Gets together: Not on file    Attends religious service: Not on file    Active member of club or organization: Not on file    Attends meetings of clubs or organizations: Not on file    Relationship status: Not on file  . Intimate partner violence    Fear of current or ex partner: Not on file    Emotionally abused: Not on file    Physically abused: Not on file    Forced sexual activity: Not on file  Other Topics Concern  . Not on file  Social History Narrative  . Not on file    Review of  Systems  Constitutional: Negative.   HENT: Negative.   Eyes: Negative.   Respiratory: Negative.   Cardiovascular: Negative.   Gastrointestinal: Negative.   Genitourinary:       Vaginal itching  Musculoskeletal: Negative.   Skin: Negative.   Neurological: Negative.   Endo/Heme/Allergies: Negative.   Psychiatric/Behavioral: Negative.     PHYSICAL EXAMINATION:    BP 118/60 (BP Location: Right Arm, Patient Position: Sitting, Cuff Size: Normal)   Pulse 76   Temp 98.4 F (36.9 C) (Skin)   Wt 119 lb 6.4 oz (54.2 kg)   LMP 10/19/2018 (Approximate)   BMI 22.56 kg/m     General appearance: alert, cooperative and appears stated age  Pelvic: External genitalia:  2 tiny ulcerations on the left side of the clitoris              Urethra:  normal appearing urethra with no masses, tenderness or lesions              Bartholins and Skenes: normal                 Vagina: slightly erythematous appearing vagina with a slight increase in watery vaginal discharge.                             Chaperone was present for exam.  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   An After Visit Summary was printed and given to the patient.  Over 25 minutes face to face time of which over 50% was spent in counseling.

## 2018-10-26 NOTE — Patient Instructions (Signed)
Genital Herpes Genital herpes is a common sexually transmitted infection (STI) that is caused by a virus. The virus spreads from person to person through sexual contact. Infection can cause itching, blisters, and sores around the genitals or rectum. Symptoms may last several days and then go away This is called an outbreak. However, the virus remains in your body, so you may have more outbreaks in the future. The time between outbreaks varies and can be months or years. Genital herpes affects men and women. It is particularly concerning for pregnant women because the virus can be passed to the baby during delivery and can cause serious problems. Genital herpes is also a concern for people who have a weak disease-fighting (immune) system. What are the causes? This condition is caused by the herpes simplex virus (HSV) type 1 or type 2. The virus may spread through:  Sexual contact with an infected person, including vaginal, anal, and oral sex.  Contact with fluid from a herpes sore.  The skin. This means that you can get herpes from an infected partner even if he or she does not have a visible sore or does not know that he or she is infected. What increases the risk? You are more likely to develop this condition if:  You have sex with many partners.  You do not use latex condoms during sex. What are the signs or symptoms? Most people do not have symptoms (asymptomatic) or have mild symptoms that may be mistaken for other skin problems. Symptoms may include:  Small red bumps near the genitals, rectum, or mouth. These bumps turn into blisters and then turn into sores.  Flu-like symptoms, including: ? Fever. ? Body aches. ? Swollen lymph nodes. ? Headache.  Painful urination.  Pain and itching in the genital area or rectal area.  Vaginal discharge.  Tingling or shooting pain in the legs and buttocks. Generally, symptoms are more severe and last longer during the first (primary)  outbreak. Flu-like symptoms are also more common during the primary outbreak. How is this diagnosed? Genital herpes may be diagnosed based on:  A physical exam.  Your medical history.  Blood tests.  A test of a fluid sample (culture) from an open sore. How is this treated? There is no cure for this condition, but treatment with antiviral medicines that are taken by mouth (orally) can do the following:  Speed up healing and relieve symptoms.  Help to reduce the spread of the virus to sexual partners.  Limit the chance of future outbreaks, or make future outbreaks shorter.  Lessen symptoms of future outbreaks. Your health care provider may also recommend pain relief medicines, such as aspirin or ibuprofen. Follow these instructions at home: Sexual activity  Do not have sexual contact during active outbreaks.  Practice safe sex. Latex condoms and female condoms may help prevent the spread of the herpes virus. General instructions  Keep the affected areas dry and clean.  Take over-the-counter and prescription medicines only as told by your health care provider.  Avoid rubbing or touching blisters and sores. If you do touch blisters or sores: ? Wash your hands thoroughly with soap and water. ? Do not touch your eyes afterward.  To help relieve pain or itching, you may take the following actions as directed by your health care provider: ? Apply a cold, wet cloth (cold compress) to affected areas 4-6 times a day. ? Apply a substance that protects your skin and reduces bleeding (astringent). ? Apply a gel that   helps relieve pain around sores (lidocaine gel). ? Take a warm, shallow bath that cleans the genital area (sitz bath).  Keep all follow-up visits as told by your health care provider. This is important. How is this prevented?  Use condoms. Although anyone can get genital herpes during sexual contact, even with the use of a condom, a condom can provide some protection.   Avoid having multiple sexual partners.  Talk with your sexual partner about any symptoms either of you may have. Also, talk with your partner about any history of STIs.  Get tested for STIs before you have sex. Ask your partner to do the same.  Do not have sexual contact if you have symptoms of genital herpes. Contact a health care provider if:  Your symptoms are not improving with medicine.  Your symptoms return.  You have new symptoms.  You have a fever.  You have abdominal pain.  You have redness, swelling, or pain in your eye.  You notice new sores on other parts of your body.  You are a woman and experience bleeding between menstrual periods.  You have had herpes and you become pregnant or plan to become pregnant. Summary  Genital herpes is a common sexually transmitted infection (STI) that is caused by the herpes simplex virus (HSV) type 1 or type 2.  These viruses are most often spread through sexual contact with an infected person.  You are more likely to develop this condition if you have sex with many partners or you have unprotected sex.  Most people do not have symptoms (asymptomatic) or have mild symptoms that may be mistaken for other skin problems. Symptoms occur as outbreaks that may happen months or years apart.  There is no cure for this condition, but treatment with oral antiviral medicines can reduce symptoms, reduce the chance of spreading the virus to a partner, prevent future outbreaks, or shorten future outbreaks. This information is not intended to replace advice given to you by your health care provider. Make sure you discuss any questions you have with your health care provider. Document Released: 02/08/2000 Document Revised: 08/17/2017 Document Reviewed: 01/11/2016 Elsevier Patient Education  2020 [employer]. Vaginitis Vaginitis is a condition in which the vaginal tissue swells and becomes red (inflamed). This condition is most often caused by a  change in the normal balance of bacteria and yeast that live in the vagina. This change causes an overgrowth of certain bacteria or yeast, which causes the inflammation. There are different types of vaginitis, but the most common types are:  Bacterial vaginosis.  Yeast infection (candidiasis).  Trichomoniasis vaginitis. This is a sexually transmitted disease (STD).  Viral vaginitis.  Atrophic vaginitis.  Allergic vaginitis. What are the causes? The cause of this condition depends on the type of vaginitis. It can be caused by:  Bacteria (bacterial vaginosis).  Yeast, which is a fungus (yeast infection).  A parasite (trichomoniasis vaginitis).  A virus (viral vaginitis).  Low hormone levels (atrophic vaginitis). Low hormone levels can occur during pregnancy, breastfeeding, or after menopause.  Irritants, such as bubble baths, scented tampons, and feminine sprays (allergic vaginitis). Other factors can change the normal balance of the yeast and bacteria that live in the vagina. These include:  Antibiotic medicines.  Poor hygiene.  Diaphragms, vaginal sponges, spermicides, birth control pills, and intrauterine devices (IUD).  Sex.  Infection.  Uncontrolled diabetes.  A weakened defense (immune) system. What increases the risk? This condition is more likely to develop in women who:  Smoke.  Use vaginal douches, scented tampons, or scented sanitary pads.  Wear tight-fitting pants.  Wear thong underwear.  Use oral birth control pills or an IUD.  Have sex without a condom.  Have multiple sex partners.  Have an STD.  Frequently use the spermicide nonoxynol-9.  Eat lots of foods high in sugar.  Have uncontrolled diabetes.  Have low estrogen levels.  Have a weakened immune system from an immune disorder or medical treatment.  Are pregnant or breastfeeding. What are the signs or symptoms? Symptoms vary depending on the cause of the vaginitis. Common  symptoms include:  Abnormal vaginal discharge. ? The discharge is white, gray, or yellow with bacterial vaginosis. ? The discharge is thick, white, and cheesy with a yeast infection. ? The discharge is frothy and yellow or greenish with trichomoniasis.  A bad vaginal smell. The smell is fishy with bacterial vaginosis.  Vaginal itching, pain, or swelling.  Sex that is painful.  Pain or burning when urinating. Sometimes there are no symptoms. How is this diagnosed? This condition is diagnosed based on your symptoms and medical history. A physical exam, including a pelvic exam, will also be done. You may also have other tests, including:  Tests to determine the pH level (acidity or alkalinity) of your vagina.  A whiff test, to assess the odor that results when a sample of your vaginal discharge is mixed with a potassium hydroxide solution.  Tests of vaginal fluid. A sample will be examined under a microscope. How is this treated? Treatment varies depending on the type of vaginitis you have. Your treatment may include:  Antibiotic creams or pills to treat bacterial vaginosis and trichomoniasis.  Antifungal medicines, such as vaginal creams or suppositories, to treat a yeast infection.  Medicine to ease discomfort if you have viral vaginitis. Your sexual partner should also be treated.  Estrogen delivered in a cream, pill, suppository, or vaginal ring to treat atrophic vaginitis. If vaginal dryness occurs, lubricants and moisturizing creams may help. You may need to avoid scented soaps, sprays, or douches.  Stopping use of a product that is causing allergic vaginitis. Then using a vaginal cream to treat the symptoms. Follow these instructions at home: Lifestyle  Keep your genital area clean and dry. Avoid soap, and only rinse the area with water.  Do not douche or use tampons until your health care provider says it is okay to do so. Use sanitary pads, if needed.  Do not have sex  until your health care provider approves. When you can return to sex, practice safe sex and use condoms.  Wipe from front to back. This avoids the spread of bacteria from the rectum to the vagina. General instructions  Take over-the-counter and prescription medicines only as told by your health care provider.  If you were prescribed an antibiotic medicine, take or use it as told by your health care provider. Do not stop taking or using the antibiotic even if you start to feel better.  Keep all follow-up visits as told by your health care provider. This is important. How is this prevented?  Use mild, non-scented products. Do not use things that can irritate the vagina, such as fabric softeners. Avoid the following products if they are scented: ? Feminine sprays. ? Detergents. ? Tampons. ? Feminine hygiene products. ? Soaps or bubble baths.  Let air reach your genital area. ? Wear cotton underwear to reduce moisture buildup. ? Avoid wearing underwear while you sleep. ? Avoid wearing tight pants  and underwear or nylons without a cotton panel. ? Avoid wearing thong underwear.  Take off any wet clothing, such as bathing suits, as soon as possible.  Practice safe sex and use condoms. Contact a health care provider if:  You have abdominal pain.  You have a fever.  You have symptoms that last for more than 2-3 days. Get help right away if:  You have a fever and your symptoms suddenly get worse. Summary  Vaginitis is a condition in which the vaginal tissue becomes inflamed.This condition is most often caused by a change in the normal balance of bacteria and yeast that live in the vagina.  Treatment varies depending on the type of vaginitis you have.  Do not douche, use tampons , or have sex until your health care provider approves. When you can return to sex, practice safe sex and use condoms. This information is not intended to replace advice given to you by your health care  provider. Make sure you discuss any questions you have with your health care provider. Document Released: 12/08/2006 Document Revised: 01/23/2017 Document Reviewed: 03/18/2016 Elsevier Patient Education  2020 [employer].

## 2018-10-27 ENCOUNTER — Telehealth: Payer: Self-pay

## 2018-10-27 LAB — HSV(HERPES SIMPLEX VRS) I + II AB-IGG
HSV 1 Glycoprotein G Ab, IgG: <0.91 index (ref 0.00–0.90)
HSV 2 IgG, Type Spec: 19.5 index — ABNORMAL HIGH (ref 0.00–0.90)

## 2018-10-27 LAB — HEP, RPR, HIV PANEL
HIV Screen 4th Generation wRfx: NONREACTIVE
Hepatitis B Surface Ag: NEGATIVE
RPR Ser Ql: NONREACTIVE

## 2018-10-27 LAB — CHLAMYDIA/GONOCOCCUS/TRICHOMONAS, NAA
Chlamydia by NAA: NEGATIVE
Gonococcus by NAA: NEGATIVE
Trich vag by NAA: NEGATIVE

## 2018-10-27 LAB — HEPATITIS C ANTIBODY: Hep C Virus Ab: <0.1 s/co ratio (ref 0.0–0.9)

## 2018-10-27 NOTE — Telephone Encounter (Signed)
Spoke with patient. Advised of message as seen below from [place]. Patient verbalizes understanding. Appointment scheduled for 11/02/2018 at 11:30 am with Dr.Jertson. Patient is agreeable to date and time.  Routing to provider and will close encounter.

## 2018-10-27 NOTE — Telephone Encounter (Signed)
Please let the patient know that the guidelines do not recommend routine screening of herpes in asymptomatic patients. We follow the guidelines. I'm happy to provider her with information on this. I think it would make sense for her to come in to discuss. That way I can give her more handouts to take with her (one from epic given yesterday).

## 2018-10-27 NOTE — Telephone Encounter (Signed)
Spoke with patient. Results given. Patient verbalizes understanding. Patient has many questions and is very upset that she was not tested for HSV in prior visits at the office. Advised HSV is not done routinely unless you are symptomatic or if patient has concerns of exposure or requests testing. Encouraged patient to have a Webex visit or come in to speak with Dr.Jertson to ensure all of her questions are answered. Patient would like to have a Webex call. Advised will review with Dr.Jertson and return call.  Dr.Jertson, no openings the rest of this week. Patient does not want to wait until next Tuesday to discuss. Please advise.

## 2018-10-27 NOTE — Telephone Encounter (Signed)
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 It goes along with her c/o recurrent focal irritation. The rest of her blood work is normal. Her cervical cultures and the swab for herpes is pending.  She was stressed about the HSV testing, see if she wants to come in to discuss further. I did give her a script for valtrex for prn use at her visit.

## 2018-10-29 LAB — HSV NAA
HSV 1 NAA: NEGATIVE
HSV 2 NAA: POSITIVE — AB

## 2018-11-02 ENCOUNTER — Encounter: Payer: Self-pay | Admitting: Obstetrics and Gynecology

## 2018-11-02 ENCOUNTER — Telehealth: Payer: Self-pay | Admitting: Obstetrics and Gynecology

## 2018-11-02 ENCOUNTER — Ambulatory Visit: Payer: BC Managed Care – PPO | Admitting: Obstetrics and Gynecology

## 2018-11-02 NOTE — Telephone Encounter (Signed)
Patient [facility] today's appointment. Left message to reschedule.

## 2018-11-02 NOTE — Progress Notes (Deleted)
GYNECOLOGY  VISIT   HPI: 23 y.o.   Single Black or African American Not Hispanic or Latino  female   G0P0000 with Patient's last menstrual period was 10/19/2018 (approximate).   here to discuss HSV lab results.   GYNECOLOGIC HISTORY: Patient's last menstrual period was 10/19/2018 (approximate). Contraception:*** Menopausal hormone therapy: None        OB History    Gravida  0   Para  0   Term  0   Preterm  0   AB  0   Living  0     SAB  0   TAB  0   Ectopic  0   Multiple  0   Live Births  0              Patient Active Problem List   Diagnosis Date Noted  . Cannabis use disorder, severe, dependence ([place]) 01/08/2017  . Major depressive disorder, recurrent episode, severe, with psychosis ([place]) 01/08/2017  . Major depressive disorder, recurrent, severe with psychotic features ([place]) 01/08/2017  . Juvenile rheumatoid arthritis ([place]) 11/17/2011    Past Medical History:  Diagnosis Date  . Anxiety   . Asthma   . Depression   . Juvenile rheumatoid arthritis ([place])   . Uveitis     Past Surgical History:  Procedure Laterality Date  . TYMPANOSTOMY TUBE PLACEMENT Bilateral 2001    Current Outpatient Medications  Medication Sig Dispense Refill  . albuterol (PROVENTIL HFA;VENTOLIN HFA) 108 (90 Base) MCG/ACT inhaler Inhale 1-2 puffs into the lungs every 6 (six) hours as needed for wheezing or shortness of breath. 1 Inhaler 0  . cetirizine (ZYRTEC) 10 MG tablet Take 1 tablet (10 mg total) by mouth daily. 30 tablet 5  . EPINEPHrine 0.3 mg/0.3 mL IJ SOAJ injection Inject 0.3 mLs (0.3 mg total) into the muscle as needed for anaphylaxis. 1 Device 1  . ibuprofen (ADVIL,MOTRIN) 800 MG tablet daily as needed.    . medroxyPROGESTERone Acetate 150 MG/ML SUSY Last one 03/09/18    . metroNIDAZOLE (METROGEL) 0.75 % vaginal gel Place 1 Applicatorful vaginally at bedtime. Use for 5 nights 70 g 0  . valACYclovir (VALTREX) 500 MG tablet Take one tablet po BID for 3 days if needed.  30 tablet 1   No current facility-administered medications for this visit.      ALLERGIES: Iodine, Fish allergy, Other, and Shrimp [shellfish allergy]  Family History  Problem Relation Age of Onset  . Breast cancer Maternal Aunt   . Diabetes Paternal Aunt   . Schizophrenia Maternal Grandmother   . Diabetes Maternal Grandfather     Social History   Socioeconomic History  . Marital status: Single    Spouse name: Not on file  . Number of children: Not on file  . Years of education: Not on file  . Highest education level: Not on file  Occupational History  . Not on file  Social Needs  . Financial resource strain: Not on file  . Food insecurity    Worry: Not on file    Inability: Not on file  . Transportation needs    Medical: Not on file    Non-medical: Not on file  Tobacco Use  . Smoking status: Former Smoker    Types: Cigars  . Smokeless tobacco: Never Used  Substance and Sexual Activity  . Alcohol use: No    Frequency: Never  . Drug use: Not on file    Comment: CBD  . Sexual activity: Yes  Partners: Male    Birth control/protection: Injection    Comment: depo  Lifestyle  . Physical activity    Days per week: Not on file    Minutes per session: Not on file  . Stress: Not on file  Relationships  . Social Musician on phone: Not on file    Gets together: Not on file    Attends religious service: Not on file    Active member of club or organization: Not on file    Attends meetings of clubs or organizations: Not on file    Relationship status: Not on file  . Intimate partner violence    Fear of current or ex partner: Not on file    Emotionally abused: Not on file    Physically abused: Not on file    Forced sexual activity: Not on file  Other Topics Concern  . Not on file  Social History Narrative  . Not on file    ROS  PHYSICAL EXAMINATION:    LMP 10/19/2018 (Approximate)     General appearance: alert, cooperative and appears stated  age Neck: no adenopathy, supple, symmetrical, trachea midline and thyroid {CHL AMB PHY EX THYROID NORM [phone]::"normal to inspection and palpation"} Breasts: {Exam; breast:13139::"normal appearance, no masses or tenderness"} Abdomen: soft, non-tender; non distended, no masses,  no organomegaly  Pelvic: External genitalia:  no lesions              Urethra:  normal appearing urethra with no masses, tenderness or lesions              Bartholins and Skenes: normal                 Vagina: normal appearing vagina with normal color and discharge, no lesions              Cervix: {CHL AMB PHY EX CERVIX NORM DEFAULT:[phone]::"no lesions"}              Bimanual Exam:  Uterus:  {CHL AMB PHY EX UTERUS NORM DEFAULT:[phone]::"normal size, contour, position, consistency, mobility, non-tender"}              Adnexa: {CHL AMB PHY EX ADNEXA NO MASS DEFAULT:[phone]::"no mass, fullness, tenderness"}              Rectovaginal: {yes no:314532}.  Confirms.              Anus:  normal sphincter tone, no lesions  Chaperone was present for exam.  ASSESSMENT     PLAN    An After Visit Summary was printed and given to the patient.  *** minutes face to face time of which over 50% was spent in counseling.

## 2018-11-29 NOTE — Progress Notes (Signed)
[facility] Note  12/01/2018     CHIEF COMPLAINT Patient presents for Retina Evaluation   HISTORY OF PRESENT ILLNESS: Kayla Ochoa is a 23 y.o. female who presents to the clinic today for:   HPI    Retina Evaluation    In both eyes.  Onset: years.  Duration: years.  Context:  distance vision, mid-range vision and near vision.  I, the attending physician,  performed the HPI with the patient and updated documentation appropriately.          Comments    Patient referred by Dr. Manuella Ghazi for FA as patient has a history of uveitis OU. Patient states vision blurred OU. Not taking any oral steroids, no gtts. History of remicaide injections. Last injection at about 23 years of age (19 years ago). Tried methotrexate in the past. Patient has rheumatoid arthritis.       Last edited by Bernarda Caffey, MD on 12/01/2018 11:01 AM. (History)    pt states she has been receiving injections in her eyes since she was 9, she states once the injections stopped working she started receiving infusions, pt has been followed by Dr. Dwana Melena for inflammation in her eye, previously pt was seeing Dr. Silvestre Gunner, pt has rheumatoid arthritis, but doesn't take any medication for it, she states she doesn't take any daily medications, pt has been using CBD to manage joint inflammtion  Referring physician: Feliz Beam, MD [address],  [place] [address]  HISTORICAL INFORMATION:   Selected notes from the MEDICAL RECORD NUMBER Referred by Dr. Dwana Melena for FA LEE:  Ocular Hx- PMH-   CURRENT MEDICATIONS: No current outpatient medications on file. (Ophthalmic Drugs)   No current facility-administered medications for this visit.  (Ophthalmic Drugs)   Current Outpatient Medications (Other)  Medication Sig  . albuterol (PROVENTIL HFA;VENTOLIN HFA) 108 (90 Base) MCG/ACT inhaler Inhale 1-2 puffs into the lungs every 6 (six) hours as needed for wheezing or shortness of breath.  .  cetirizine (ZYRTEC) 10 MG tablet Take 1 tablet (10 mg total) by mouth daily.  Marland Kitchen EPINEPHrine 0.3 mg/0.3 mL IJ SOAJ injection Inject 0.3 mLs (0.3 mg total) into the muscle as needed for anaphylaxis.  Marland Kitchen ibuprofen (ADVIL,MOTRIN) 800 MG tablet daily as needed.  . medroxyPROGESTERone Acetate 150 MG/ML SUSY Last one 03/09/18  . metroNIDAZOLE (METROGEL) 0.75 % vaginal gel Place 1 Applicatorful vaginally at bedtime. Use for 5 nights  . valACYclovir (VALTREX) 500 MG tablet Take one tablet po BID for 3 days if needed.   No current facility-administered medications for this visit.  (Other)      REVIEW OF SYSTEMS: ROS    Positive for: Eyes, Respiratory   Negative for: Constitutional, Gastrointestinal, Neurological, Skin, Genitourinary, Musculoskeletal, HENT, Endocrine, Cardiovascular, Psychiatric, Allergic/Imm, Heme/Lymph   Last edited by Roselee Nova D, COT on 12/01/2018  9:56 AM. (History)       ALLERGIES Allergies  Allergen Reactions  . Iodine Anaphylaxis  . Fish Allergy Diarrhea and Swelling    Swelling to face. Tested positive  . Other Swelling    Tree nuts Pecans strawberries  . Shrimp [Shellfish Allergy] Swelling    PAST MEDICAL HISTORY Past Medical History:  Diagnosis Date  . Anxiety   . Asthma   . Depression   . Juvenile rheumatoid arthritis ([place])   . Uveitis    Past Surgical History:  Procedure Laterality Date  . TYMPANOSTOMY TUBE PLACEMENT Bilateral 2001    FAMILY HISTORY  Family History  Problem Relation Age of Onset  . Breast cancer Maternal Aunt   . Diabetes Paternal Aunt   . Schizophrenia Maternal Grandmother   . Diabetes Maternal Grandfather     SOCIAL HISTORY Social History   Tobacco Use  . Smoking status: Former Smoker    Types: Cigars  . Smokeless tobacco: Never Used  Substance Use Topics  . Alcohol use: No    Frequency: Never  . Drug use: Not on file    Comment: CBD         OPHTHALMIC EXAM:  Base Eye Exam    Visual Acuity (Snellen -  Linear)      Right Left   Dist [place] 20/150 +2 20/25   Dist ph [place] 20/30 20/20       Tonometry (Tonopen, 10:02 AM)      Right Left   Pressure 19 22       Pupils      Dark Light Shape React APD   Right 4 3 Round Slow None   Left 4 3 Round Slow None       Visual Fields (Counting fingers)      Left Right    Full Full       Extraocular Movement      Right Left    Full, Ortho Full, Ortho       Neuro/Psych    Oriented x3: Yes   Mood/Affect: Normal       Dilation    Both eyes: 1.0% Mydriacyl, 2.5% Phenylephrine @ 10:02 AM        Slit Lamp and Fundus Exam    Slit Lamp Exam      Right Left   Lids/Lashes Mild Meibomian gland dysfunction Mild Meibomian gland dysfunction   Conjunctiva/Sclera Mild Melanosis, no injection Mild Melanosis, no injection   Cornea Trace Debris in tear film, trace Punctate epithelial erosions Trace Debris in tear film, trace Punctate epithelial erosions, early band K nasal and temporal   Anterior Chamber Deep and quiet, no cell, no flare Deep and quiet, no cell, no flare   Iris Round and dilated Round and dilated   Lens Clear Clear   Vitreous Vitreous syneresis, no cells Vitreous syneresis, no cells       Fundus Exam      Right Left   Disc Pink and Sharp Pink and Sharp   C/D Ratio 0.3 0.3   Macula Flat, Good foveal reflex, focal RPE irregularity nasal macula, No heme or edema Flat, Good foveal reflex, No heme or edema   Vessels attenuation, +perivascular sheating of venules, Copper wiring attenuation, +perivascular sheating of venules, Copper wiring   Periphery Attached, 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 Central Foveal Thickness: 220. Progression has no prior data. Findings include normal foveal contour, no IRF, no SRF (Retinal tear and schisis IT periphery caught on widefield).   Left Eye Quality was good. Central Foveal Thickness: 196. Progression has no prior data. Findings include normal foveal contour, no  IRF, no SRF.   Notes *Images captured and stored on drive  Diagnosis / Impression:  NFP, no IRF/SRF OU OD: bullous retinoschisis w/ outer retinal hole IT periphery caught on widefield -- no SRF/RD  Clinical management:  See below  Abbreviations: NFP - Normal foveal profile. CME - cystoid macular edema. PED - pigment epithelial detachment. IRF - intraretinal fluid. SRF - subretinal fluid. EZ - ellipsoid zone. ERM - epiretinal membrane. ORA - outer retinal atrophy. ORT - outer retinal tubulation. SRHM - subretinal hyper-reflective material        Fluorescein Angiography - OU - Both Eyes       Right Eye   Progression has no prior data. Early phase findings include staining, window defect. Mid/Late phase findings include staining, window defect (No perivascular leakage / vasculitis).   Left Eye   Progression has no prior data. Early phase findings include normal observations. Mid/Late phase findings include normal observations.   Notes **Images stored on drive**  Impression: No vasculitis or leakage OU OD: mild staining of retinoschisis cavity and surrounding RPE changes, IT periphery OS: normal study                  ASSESSMENT/PLAN:    ICD-10-CM   1. 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 Retinal hole of right eye  H33.321   4. Retinal edema  H35.81 OCT, Retina - OU - Both Eyes  5. Hypertensive retinopathy of both eyes  H35.033 Fluorescein Angiography - OU - Both Eyes    1. JIA without ocular inflammation - pt of Dr. Sherryll BurgerShah, previously Dr. Eustaquio MaizeKurup, Uveitis/Retina Specialist -  previously on MTX -- stopped in 2017 -- in the process of re-establishing with Rheumatology - exam without active anterior or posterior inflammation; +stable perivascular sheathing of venules - referred here for FA - FA 12/01/18 shows staining of inf temporal retinoschisis cavity, no vasculitis or perivascular leakage - will share images with Dr. Sherryll BurgerShah for review - pt scheduled for f/u with Dr. Sherryll BurgerShah later this month - f/u here prn per Dr. Sherryll BurgerShah  2-4. 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 Sherryll BurgerShah     Ophthalmic Meds Ordered this visit:  No orders of the defined types were placed in this encounter.      Return if symptoms worsen or fail to improve.  There are no Patient Instructions on file for this visit.   Explained the diagnoses, plan, and follow up with the patient and they expressed understanding.  Patient expressed understanding of the importance of proper follow up care.   This document serves as a record of services personally performed by Karie ChimeraBrian G. Ma Munoz, MD, PhD. It was created on their behalf by Laurian BrimAmanda Brown, OA, an ophthalmic assistant. The creation of this record is the provider's dictation and/or activities during the visit.    Electronically signed by: Laurian BrimAmanda Brown, OA  10.05.2020 12:12 PM    Karie ChimeraBrian G. Ken[place]ton Mielke, M.D., Ph.D. Diseases & Surgery of the Retina and Vitreous [facility]   I have reviewed the above documentation for accuracy and completeness, and I agree with the above. Karie ChimeraBrian G. Saniyyah Elster, M.D., Ph.D. 12/01/18 12:12 PM     Abbreviations: M myopia (nearsighted); A astigmatism; H hyperopia (farsighted); P  presbyopia; Mrx spectacle prescription;  CTL contact lenses; OD right eye; OS left eye; OU both eyes  XT exotropia; ET  esotropia; PEK punctate epithelial keratitis; PEE punctate epithelial erosions; DES dry eye syndrome; MGD meibomian gland dysfunction; ATs artificial tears; PFAT's preservative free artificial tears; NSC nuclear sclerotic cataract; PSC posterior subcapsular cataract; ERM epi-retinal membrane; PVD posterior vitreous detachment; RD retinal detachment; DM diabetes mellitus; DR diabetic retinopathy; NPDR non-proliferative diabetic retinopathy; PDR proliferative diabetic retinopathy; CSME clinically significant macular edema; DME diabetic macular edema; dbh dot blot hemorrhages; CWS cotton wool spot; POAG primary open angle glaucoma; C/D cup-to-disc ratio; HVF humphrey visual field; GVF goldmann visual field; OCT optical coherence tomography; IOP intraocular pressure; BRVO Branch retinal vein occlusion; CRVO central retinal vein occlusion; CRAO central retinal artery occlusion; BRAO branch retinal artery occlusion; RT retinal tear; SB scleral buckle; PPV pars plana vitrectomy; VH Vitreous hemorrhage; PRP panretinal laser photocoagulation; IVK intravitreal kenalog; VMT vitreomacular traction; MH Macular hole;  NVD neovascularization of the disc; NVE neovascularization elsewhere; AREDS age related eye disease study; ARMD age related macular degeneration; POAG primary open angle glaucoma; EBMD epithelial/anterior basement membrane dystrophy; ACIOL anterior chamber intraocular lens; IOL intraocular lens; PCIOL posterior chamber intraocular lens; Phaco/IOL phacoemulsification with intraocular lens placement; PRK photorefractive keratectomy; LASIK laser assisted in situ keratomileusis; HTN hypertension; DM diabetes mellitus; COPD chronic obstructive pulmonary disease

## 2018-12-01 ENCOUNTER — Ambulatory Visit (INDEPENDENT_AMBULATORY_CARE_PROVIDER_SITE_OTHER): Payer: BC Managed Care – PPO | Admitting: Ophthalmology

## 2018-12-01 ENCOUNTER — Other Ambulatory Visit: Payer: Self-pay

## 2018-12-01 ENCOUNTER — Encounter (INDEPENDENT_AMBULATORY_CARE_PROVIDER_SITE_OTHER): Payer: Self-pay | Admitting: Ophthalmology

## 2018-12-01 DIAGNOSIS — H3581 Retinal edema: Secondary | ICD-10-CM | POA: Diagnosis not present

## 2018-12-01 DIAGNOSIS — H35033 Hypertensive retinopathy, bilateral: Secondary | ICD-10-CM | POA: Diagnosis not present

## 2018-12-01 DIAGNOSIS — M0800 Unspecified juvenile rheumatoid arthritis of unspecified site: Secondary | ICD-10-CM

## 2018-12-01 DIAGNOSIS — H33321 Round hole, right eye: Secondary | ICD-10-CM | POA: Diagnosis not present

## 2018-12-01 DIAGNOSIS — H33101 Unspecified retinoschisis, right eye: Secondary | ICD-10-CM

## 2019-01-10 ENCOUNTER — Ambulatory Visit: Payer: BC Managed Care – PPO

## 2019-01-10 ENCOUNTER — Other Ambulatory Visit: Payer: Self-pay

## 2019-01-10 ENCOUNTER — Ambulatory Visit (INDEPENDENT_AMBULATORY_CARE_PROVIDER_SITE_OTHER): Payer: BC Managed Care – PPO | Admitting: *Deleted

## 2019-01-10 VITALS — BP 114/60 | HR 90 | Temp 97.5°F | Resp 14 | Ht 61.0 in | Wt 122.0 lb

## 2019-01-10 DIAGNOSIS — Z3042 Encounter for surveillance of injectable contraceptive: Secondary | ICD-10-CM | POA: Diagnosis not present

## 2019-01-10 MED ORDER — MEDROXYPROGESTERONE ACETATE 150 MG/ML IM SUSP
150.0000 mg | Freq: Once | INTRAMUSCULAR | Status: AC
  Administered 2019-01-10: 14:00:00 150 mg via INTRAMUSCULAR

## 2019-01-10 NOTE — Progress Notes (Deleted)
Patient is here for Depo Provera Injection Patient is within Depo Provera Calender Limits yes, last given 10-25-2018  Next Depo Due between: 03-27-2018 to 04-10-2018  Last AEX: 04-08-2018 SM  AEX Scheduled: not currently scheduled   Patient is aware when next depo is due  Pt tolerated Injection well.  Routed to provider for review, encounter closed.

## 2019-01-10 NOTE — Progress Notes (Signed)
Patient is here for Depo Provera Injection Patient is within Depo Provera Calender Limits yes, last given 10-25-2018  Next Depo Due between: 03-28-19 to 04-11-19 Last AEX: 04-08-2018  AEX Scheduled: 03-28-19  Patient is aware when next depo is due  Pt tolerated Injection well in right deltoid.   Patient also complaining of vaginal odor. States it started last week when her cycle started. Denies discharge, but complaining of menstrual cramps. Reviewed with Dr. Talbert Nan. Patient scheduled for 01-11-2019 at 1345.   Routed to provider for review, encounter closed.

## 2019-01-11 ENCOUNTER — Ambulatory Visit: Payer: BC Managed Care – PPO | Admitting: Obstetrics and Gynecology

## 2019-01-11 NOTE — Progress Notes (Deleted)
GYNECOLOGY  VISIT   HPI: 23 y.o.   Single Black or African American Not Hispanic or Latino  female   G0P0000 with Patient's last menstrual period was 01/03/2019.   here for     GYNECOLOGIC HISTORY: Patient's last menstrual period was 01/03/2019. Contraception:*** Menopausal hormone therapy: ***        OB History    Gravida  0   Para  0   Term  0   Preterm  0   AB  0   Living  0     SAB  0   TAB  0   Ectopic  0   Multiple  0   Live Births  0              Patient Active Problem List   Diagnosis Date Noted  . Cannabis use disorder, severe, dependence (HCC) 01/08/2017  . Major depressive disorder, recurrent episode, severe, with psychosis ([facility]) 01/08/2017  . Major depressive disorder, recurrent, severe with psychotic features ([facility]) 01/08/2017  . 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 Anxiety   . Asthma   . Depression   . Juvenile rheumatoid arthritis (HCC)   . Uveitis     Past Surgical History:  Procedure Laterality Date  . TYMPANOSTOMY TUBE PLACEMENT Bilateral 2001    Current Outpatient Medications  Medication Sig Dispense Refill  . albuterol (PROVENTIL HFA;VENTOLIN HFA) 108 (90 Base) MCG/ACT inhaler Inhale 1-2 puffs into the lungs every 6 (six) hours as needed for wheezing or shortness of breath. 1 Inhaler 0  . cetirizine (ZYRTEC) 10 MG tablet Take 1 tablet (10 mg total) by mouth daily. 30 tablet 5  . EPINEPHrine 0.3 mg/0.3 mL IJ SOAJ injection Inject 0.3 mLs (0.3 mg total) into the muscle as needed for anaphylaxis. 1 Device 1  . ibuprofen (ADVIL,MOTRIN) 800 MG tablet daily as needed.    . medroxyPROGESTERone Acetate 150 MG/ML SUSY Last one 03/09/18    . valACYclovir (VALTREX) 500 MG tablet Take one tablet po BID for 3 days if needed. 30 tablet 1   No current facility-administered medications for this visit.      ALLERGIES: Iodine, Fish allergy, Other, and Shrimp [shellfish allergy]  Family  History  Problem Relation Age of Onset  . Breast cancer Maternal Aunt   . Diabetes Paternal Aunt   . Schizophrenia Maternal Grandmother   . Diabetes Maternal Grandfather     Social History   Socioeconomic History  . Marital status: Single    Spouse name: Not on file  . Number of children: Not on file  . Years of education: Not on file  . Highest education level: Not on file  Occupational History  . Not on file  Social Needs  . Financial resource strain: Not on file  . Food insecurity    Worry: Not on file    Inability: Not on file  . Transportation needs    Medical: Not on file    Non-medical: Not on file  Tobacco Use  . Smoking status: Former Smoker    Types: Cigars  . Smokeless tobacco: Never Used  Substance and Sexual Activity  . Alcohol use: No    Frequency: Never  . Drug use: Not on file    Comment: CBD  . Sexual activity: Yes    Partners: Male    Birth control/protection: Injection    Comment: depo  Lifestyle  . Physical activity    Days per  week: Not on file    Minutes per session: Not on file  . Stress: Not on file  Relationships  . Social Herbalist on phone: Not on file    Gets together: Not on file    Attends religious service: Not on file    Active member of club or organization: Not on file    Attends meetings of clubs or organizations: Not on file    Relationship status: Not on file  . Intimate partner violence    Fear of current or ex partner: Not on file    Emotionally abused: Not on file    Physically abused: Not on file    Forced sexual activity: Not on file  Other Topics Concern  . Not on file  Social History Narrative  . Not on file    ROS  PHYSICAL EXAMINATION:    LMP 01/03/2019     General appearance: alert, cooperative and appears stated age Neck: no adenopathy, supple, symmetrical, trachea midline and thyroid {CHL AMB PHY EX THYROID NORM DEFAULT:[phone]::"normal to inspection and palpation"} Breasts: {Exam;  breast:13139::"normal appearance, no masses or tenderness"} Abdomen: soft, non-tender; non distended, no masses,  no organomegaly  Pelvic: External genitalia:  no lesions              Urethra:  normal appearing urethra with no masses, tenderness or lesions              Bartholins and Skenes: normal                 Vagina: normal appearing vagina with normal color and discharge, no lesions              Cervix: {CHL AMB PHY EX CERVIX NORM DEFAULT:[phone]::"no lesions"}              Bimanual Exam:  Uterus:  {CHL AMB PHY EX UTERUS NORM DEFAULT:[phone]::"normal size, contour, position, consistency, mobility, non-tender"}              Adnexa: {CHL AMB PHY EX ADNEXA NO MASS DEFAULT:[phone]::"no mass, fullness, tenderness"}              Rectovaginal: {yes no:314532}.  Confirms.              Anus:  normal sphincter tone, no lesions  Chaperone was present for exam.  ASSESSMENT     PLAN    An After Visit Summary was printed and given to the patient.  *** minutes face to face time of which over 50% was spent in counseling.

## 2019-01-22 ENCOUNTER — Emergency Department (HOSPITAL_COMMUNITY)
Admission: EM | Admit: 2019-01-22 | Discharge: 2019-01-23 | Disposition: A | Discharge status: Home / Self Care | Payer: Medicaid Other | Attending: Emergency Medicine | Admitting: Emergency Medicine

## 2019-01-22 ENCOUNTER — Other Ambulatory Visit: Payer: Self-pay

## 2019-01-22 ENCOUNTER — Encounter (HOSPITAL_COMMUNITY): Payer: Self-pay

## 2019-01-22 DIAGNOSIS — F410 Panic disorder [episodic paroxysmal anxiety] without agoraphobia: Secondary | ICD-10-CM | POA: Insufficient documentation

## 2019-01-22 DIAGNOSIS — M0800 Unspecified juvenile rheumatoid arthritis of unspecified site: Secondary | ICD-10-CM | POA: Diagnosis not present

## 2019-01-22 DIAGNOSIS — Z87891 Personal history of nicotine dependence: Secondary | ICD-10-CM | POA: Diagnosis not present

## 2019-01-22 DIAGNOSIS — R000 Tachycardia, unspecified: Secondary | ICD-10-CM

## 2019-01-22 DIAGNOSIS — Z79899 Other long term (current) drug therapy: Secondary | ICD-10-CM | POA: Diagnosis not present

## 2019-01-22 LAB — COMPREHENSIVE METABOLIC PANEL
ALT: 55 U/L — ABNORMAL HIGH (ref 0–44)
AST: 31 U/L (ref 15–41)
Albumin: 4 g/dL (ref 3.5–5.0)
Alkaline Phosphatase: 44 U/L (ref 38–126)
Anion gap: 9 (ref 5–15)
BUN: 8 mg/dL (ref 6–20)
CO2: 20 mmol/L — ABNORMAL LOW (ref 22–32)
Calcium: 9.2 mg/dL (ref 8.9–10.3)
Chloride: 108 mmol/L (ref 98–111)
Creatinine, Ser: 0.69 mg/dL (ref 0.44–1.00)
GFR calc Af Amer: >60 mL/min (ref >60)
GFR calc non Af Amer: >60 mL/min (ref >60)
Glucose, Bld: 99 mg/dL (ref 70–99)
Potassium: 4 mmol/L (ref 3.5–5.1)
Sodium: 137 mmol/L (ref 135–145)
Total Bilirubin: 0.5 mg/dL (ref 0.3–1.2)
Total Protein: 7.1 g/dL (ref 6.5–8.1)

## 2019-01-22 LAB — URINALYSIS, ROUTINE W REFLEX MICROSCOPIC
Bacteria, UA: NONE SEEN
Bilirubin Urine: NEGATIVE
Glucose, UA: NEGATIVE mg/dL
Hgb urine dipstick: NEGATIVE
Ketones, ur: NEGATIVE mg/dL
Leukocytes,Ua: NEGATIVE
Nitrite: NEGATIVE
Protein, ur: 30 mg/dL — AB
Specific Gravity, Urine: 1.023 (ref 1.005–1.030)
pH: 7 (ref 5.0–8.0)

## 2019-01-22 LAB — RAPID URINE DRUG SCREEN, HOSP PERFORMED
Amphetamines: NOT DETECTED
Barbiturates: NOT DETECTED
Benzodiazepines: NOT DETECTED
Cocaine: NOT DETECTED
Opiates: NOT DETECTED
Tetrahydrocannabinol: POSITIVE — AB

## 2019-01-22 LAB — CBC
HCT: 37.5 % (ref 36.0–46.0)
Hemoglobin: 12.4 g/dL (ref 12.0–15.0)
MCH: 27.9 pg (ref 26.0–34.0)
MCHC: 33.1 g/dL (ref 30.0–36.0)
MCV: 84.5 fL (ref 80.0–100.0)
Platelets: 267 10*3/uL (ref 150–400)
RBC: 4.44 MIL/uL (ref 3.87–5.11)
RDW: 13.5 % (ref 11.5–15.5)
WBC: 7.3 10*3/uL (ref 4.0–10.5)
nRBC: 0 % (ref 0.0–0.2)

## 2019-01-22 LAB — ETHANOL: Alcohol, Ethyl (B): <10 mg/dL (ref <10)

## 2019-01-22 LAB — I-STAT BETA HCG BLOOD, ED (MC, WL, AP ONLY): I-stat hCG, quantitative: <5 m[IU]/mL (ref <5)

## 2019-01-22 LAB — TSH: TSH: 0.777 u[IU]/mL (ref 0.350–4.500)

## 2019-01-22 LAB — TROPONIN I (HIGH SENSITIVITY): Troponin I (High Sensitivity): 12 ng/L (ref <18)

## 2019-01-22 MED ORDER — LACTATED RINGERS IV BOLUS
1000.0000 mL | Freq: Once | INTRAVENOUS | Status: AC
  Administered 2019-01-22: 1000 mL via INTRAVENOUS

## 2019-01-22 MED ORDER — PANTOPRAZOLE SODIUM 20 MG PO TBEC: 20.0000 mg | DELAYED_RELEASE_TABLET | Freq: Every day | ORAL | 0 refills | Status: DC

## 2019-01-22 MED ORDER — PANTOPRAZOLE SODIUM 40 MG IV SOLR
40.0000 mg | Freq: Once | INTRAVENOUS | Status: AC
  Administered 2019-01-22: 40 mg via INTRAVENOUS
  Filled 2019-01-22: qty 40

## 2019-01-22 NOTE — ED Notes (Signed)
Sent a urine culture with the urine specimen 

## 2019-01-22 NOTE — ED Provider Notes (Signed)
Plan at signout to f/u on labs, reassess and likely d/c home   Ripley Fraise, MD 01/22/19 2343

## 2019-01-22 NOTE — ED Triage Notes (Signed)
Pt BIB [facility] for eval of tachycardia. EMS reports pt was 170s on their arrival, endorsing Chest pressure and palpitations. Pt reported she felt as though she was having a panic attack. Pt denies hx of SVT. EMS reports that she did convert w/ valsalva maneuver. Arrives w/ a rate in the 140s, rec'd 400cc NS w/ EMS. GCS 15, NAD on arrival. #20G L AC PIV. Pt reports she did use CBD oil PTA, aroudn friend that was smoking MJ.

## 2019-01-22 NOTE — ED Provider Notes (Signed)
[facility] EMERGENCY DEPARTMENT Provider Note   CSN: [id number] Arrival date & time: 01/22/19  2009     History   Chief Complaint No chief complaint on file.   HPI Kayla Ochoa is a 23 y.o. female.     HPI Patient reports that she was driving her car she started to get extremely anxious and felt like she was getting a panic attack.  She reports her heart was racing and she felt like she had pressure in her chest.  She had to pull over and call EMS.  Patient reports that she has a lot of problems with reflux and she thinks maybe that trigger some of the symptoms.  She had eaten a cookout and has been off of her PPIs for a month.  She reports also she had smoked some CBD.  It seemed to happen pretty quickly after that.  She reports one other time she had a pretty similar episode of the panic attack type symptoms after taking a 500 mg CBD gummy a few months ago.  She denies she had fever or chills.  She has not had cough.  She denies she has been sick recently.  She reports she is starting to feel better now. Past Medical History:  Diagnosis Date  . Anxiety   . Asthma   . Depression   . 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 Uveitis     Patient Active Problem List   Diagnosis Date Noted  . Cannabis use disorder, severe, dependence ([place]) 01/08/2017  . Major depressive disorder, recurrent episode, severe, with psychosis ([place]) 01/08/2017  . Major depressive disorder, recurrent, severe with psychotic features ([place]) 01/08/2017  . 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 TYMPANOSTOMY TUBE PLACEMENT Bilateral 2001     OB History    Gravida  0   Para  0   Term  0   Preterm  0   AB  0   Living  0     SAB  0   TAB  0   Ectopic  0   Multiple  0   Live Births  0            Home Medications    Prior to Admission medications   Medication Sig Start Date End Date Taking?  Authorizing Provider  albuterol (PROVENTIL HFA;VENTOLIN HFA) 108 (90 Base) MCG/ACT inhaler Inhale 1-2 puffs into the lungs every 6 (six) hours as needed for wheezing or shortness of breath. 03/04/17  Yes Caryl Ada K, PA-C  EPINEPHrine 0.3 mg/0.3 mL IJ SOAJ injection Inject 0.3 mLs (0.3 mg total) into the muscle as needed for anaphylaxis. 04/08/18  Yes Megan Salon, MD  medroxyPROGESTERone Acetate 150 MG/ML SUSY Inject 150 mg into the muscle every 3 (three) months.  03/09/18  Yes [provider]  valACYclovir (VALTREX) 500 MG tablet Take one tablet po BID for 3 days if needed. Patient taking differently: Take 500 mg by mouth 2 (two) times daily. for 3 days if needed. 10/26/18  Yes Salvadore Dom, MD  cetirizine (ZYRTEC) 10 MG tablet Take 1 tablet (10 mg total) by mouth daily. Patient not taking: Reported on 01/22/2019 04/08/18   Megan Salon, MD    Family History Family History  Problem Relation Age of Onset  . Breast cancer Maternal Aunt   . Diabetes Paternal Aunt   . Schizophrenia Maternal Grandmother   . Diabetes Maternal Grandfather  Social History Social History   Tobacco Use  . Smoking status: Former Smoker    Types: Cigars  . Smokeless tobacco: Never Used  Substance Use Topics  . Alcohol use: No    Frequency: Never  . Drug use: Not on file    Comment: CBD     Allergies   Iodine, Fish allergy, Other, and Shrimp [shellfish allergy]   Review of Systems Review of Systems 10 Systems reviewed and are negative for acute change except as noted in the HPI.   Physical Exam Updated Vital Signs BP 126/72   Pulse (!) 104   Resp 17   Ht 5\' 1"  (1.549 m)   Wt 55.8 kg   LMP 01/03/2019   SpO2 98%   BMI 23.24 kg/m   Physical Exam Constitutional:      Comments: Alert and nontoxic.  No distress.  HENT:     Head: Normocephalic and atraumatic.  Eyes:     Comments: Diffuse scleral injection bilateral eyes.  Extraocular motions intact.  Pupils symmetric  and responsive.  Cardiovascular:     Comments: Tachycardia.  Regular.  No gross rub murmur gallop. Pulmonary:     Effort: Pulmonary effort is normal.     Breath sounds: Normal breath sounds.  Abdominal:     General: There is no distension.     Palpations: Abdomen is soft.     Tenderness: There is no abdominal tenderness. There is no guarding.  Musculoskeletal: Normal range of motion.        General: No swelling or tenderness.     Right lower leg: No edema.     Left lower leg: No edema.  Skin:    General: Skin is warm and dry.  Neurological:     General: No focal deficit present.     Mental Status: She is oriented to person, place, and time.     Cranial Nerves: No cranial nerve deficit.     Coordination: Coordination normal.  Psychiatric:        Mood and Affect: Mood normal.      ED Treatments / Results  Labs (all labs ordered are listed, but only abnormal results are displayed) Labs Reviewed  URINALYSIS, ROUTINE W REFLEX MICROSCOPIC - Abnormal; Notable for the following components:      Result Value   APPearance CLOUDY (*)    Protein, ur 30 (*)    All other components within normal limits  RAPID URINE DRUG SCREEN, HOSP PERFORMED - Abnormal; Notable for the following components:   Tetrahydrocannabinol POSITIVE (*)    All other components within normal limits  ETHANOL  CBC  COMPREHENSIVE METABOLIC PANEL  TSH  I-STAT BETA HCG BLOOD, ED ([facility], [facility], [facility] ONLY)  TROPONIN I (HIGH SENSITIVITY)  TROPONIN I (HIGH SENSITIVITY)    EKG EKG Interpretation  Date/Time:  Saturday January 22 2019 20:15:30 EST Ventricular Rate:  145 PR Interval:    QRS Duration: 76 QT Interval:  279 QTC Calculation: 434 R Axis:   48 Text Interpretation: Sinus tachycardia no change from previous Confirmed by 11-01-1966 [id number]) on 01/22/2019 10:20:14 PM   Radiology No results found.  Procedures Procedures (including critical care time)  Medications Ordered in ED Medications  lactated  ringers bolus 1,000 mL (1,000 mLs Intravenous New Bag/Given 01/22/19 2224)  pantoprazole (PROTONIX) injection 40 mg (40 mg Intravenous Given 01/22/19 2224)     Initial Impression / Assessment and Plan / ED Course  I have reviewed the triage vital signs and the nursing  notes.  Pertinent labs & imaging results that were available during my care of the patient were reviewed by me and considered in my medical decision making (see chart for details).       Patient presents with outlined above.  She expresses a concern that she may be dehydrated.  Symptoms did seem to correlate with smoking some CBD.  Patient's eyes are very injected.  Mental status however is clear.  She is in no distress at this time.  She is not experiencing chest pain.  Heart rate was tachycardic.  Monitor shows sinus rhythm ranging from 110s  to 130s.  EKG is similar to previous EKGs.  Will give a liter of fluids and Protonix based on patient's report of significant problems with reflux.  I do feel symptoms are related to smoking CBD or TCH.  Drug screen is positive for TCH.  Patient was stable for discharge with follow-up with PCP.  Final Clinical Impressions(s) / ED Diagnoses   Final diagnoses:  Panic attack    ED Discharge Orders    None       Arby Barrette, MD 01/22/19 2332

## 2019-01-23 LAB — TROPONIN I (HIGH SENSITIVITY): Troponin I (High Sensitivity): 14 ng/L (ref <18)

## 2019-01-23 NOTE — ED Notes (Signed)
Patient verbalizes understanding of discharge instructions. Opportunity for questioning and answers were provided. Armband removed by staff, pt discharged from ED. Pt. ambulatory and discharged home.  

## 2019-01-23 NOTE — ED Provider Notes (Signed)
Patient is improved.  Vital signs are improved.  She is in no acute distress.  Will refer to cardiology as review of chart reveals she has had tachycardic episodes previously Will d/c home    Ripley Fraise, MD 01/23/19 [phone]

## 2019-01-23 NOTE — ED Provider Notes (Signed)
ED ECG REPORT   Date: 01/23/2019 0006  Rate: 103  Rhythm: normal sinus rhythm  QRS Axis: normal  Intervals: normal  ST/T Wave abnormalities: normal  Conduction Disutrbances:none  Narrative Interpretation:   Old EKG Reviewed: improved heart rate  I have personally reviewed the EKG tracing and agree with the computerized printout as noted.    Ripley Fraise, MD 01/23/19 [phone]

## 2019-03-18 ENCOUNTER — Telehealth: Payer: Self-pay | Admitting: Obstetrics and Gynecology

## 2019-03-18 MED ORDER — AZITHROMYCIN 250 MG PO TABS: 1000.0000 mg | ORAL_TABLET | Freq: Once | ORAL | 0 refills | Status: AC

## 2019-03-18 NOTE — Telephone Encounter (Signed)
Spoke with patient. Patient had unprotected intercourse with a previous partner, was advised he had been treated for chlamydia. Patient denies any current symptoms, requesting STD testing. Depo provera for contraceptive, does not have menses. OV scheduled for 1/25 at 4:30pm with Dr. Oscar La. Covid 19 prescreen negative, precautions reviewed.    Routing to provider for final review. Patient is agreeable to disposition. Will close encounter.

## 2019-03-18 NOTE — Telephone Encounter (Signed)
Patient is calling to schedule STD testing. Patient stated that she had a potential exposure to chlamydia and is wanting to be checked. Patient scheduled for first available OV with Dr. Oscar La on 03/30/2019. Advised patient that I would speak with nurse regarding moving appointment to an earlier date.

## 2019-03-18 NOTE — Addendum Note (Signed)
Addended by: Leda Min on: 03/18/2019 11:39 AM   Modules accepted: Orders

## 2019-03-18 NOTE — Telephone Encounter (Signed)
Spoke with patient, advised per Dr. Oscar La. RX to verified pharmacy. Patient verbalizes understanding and is agreeable.

## 2019-03-18 NOTE — Telephone Encounter (Signed)
Since she has had a possible exposure, I would recommend treating her with Azithromycin 1 gram po x 1 now. She should still come in for testing on Monday to screen for STD's.

## 2019-03-21 ENCOUNTER — Ambulatory Visit: Payer: BC Managed Care – PPO | Admitting: Obstetrics and Gynecology

## 2019-03-21 ENCOUNTER — Telehealth: Payer: Self-pay | Admitting: Obstetrics and Gynecology

## 2019-03-21 NOTE — Telephone Encounter (Signed)
Patient did not keep scheduled appointment this afternoon. Called patient, rescheduled to tomorrow 03/22/19.

## 2019-03-22 ENCOUNTER — Telehealth: Payer: Self-pay | Admitting: Obstetrics and Gynecology

## 2019-03-22 ENCOUNTER — Ambulatory Visit: Payer: BC Managed Care – PPO | Admitting: Obstetrics and Gynecology

## 2019-03-22 NOTE — Telephone Encounter (Signed)
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

## 2019-03-28 ENCOUNTER — Ambulatory Visit: Payer: BC Managed Care – PPO

## 2019-03-28 NOTE — Progress Notes (Deleted)
Patient is here for Depo Provera Injection Patient is within Depo Provera Calender Limits 02/01-02/15/2021 Next Depo Due between: 4/19-06/27/2019 Last AEX: not here AEX Scheduled: not scheduled. Has next OV 03/30/2019  Patient is aware when next depo is due  Pt tolerated Injection well in ***  Routed to provider for review, encounter closed.

## 2019-03-30 ENCOUNTER — Other Ambulatory Visit: Payer: Self-pay

## 2019-03-30 ENCOUNTER — Ambulatory Visit: Payer: BC Managed Care – PPO | Admitting: Obstetrics and Gynecology

## 2019-03-31 ENCOUNTER — Encounter: Payer: Self-pay | Admitting: Obstetrics and Gynecology

## 2019-03-31 ENCOUNTER — Ambulatory Visit (INDEPENDENT_AMBULATORY_CARE_PROVIDER_SITE_OTHER): Payer: BC Managed Care – PPO | Admitting: Obstetrics and Gynecology

## 2019-03-31 VITALS — BP 100/60 | HR 91 | Temp 98.9°F | Ht 61.0 in | Wt 120.0 lb

## 2019-03-31 DIAGNOSIS — Z8619 Personal history of other infectious and parasitic diseases: Secondary | ICD-10-CM | POA: Diagnosis not present

## 2019-03-31 DIAGNOSIS — Z3042 Encounter for surveillance of injectable contraceptive: Secondary | ICD-10-CM | POA: Diagnosis not present

## 2019-03-31 DIAGNOSIS — Z113 Encounter for screening for infections with a predominantly sexual mode of transmission: Secondary | ICD-10-CM | POA: Diagnosis not present

## 2019-03-31 MED ORDER — MEDROXYPROGESTERONE ACETATE 150 MG/ML IM SUSP
150.0000 mg | Freq: Once | INTRAMUSCULAR | Status: AC
  Administered 2019-03-31: 150 mg via INTRAMUSCULAR

## 2019-03-31 NOTE — Addendum Note (Signed)
Addended by: Blima Ledger on: 03/31/2019 01:41 PM   Modules accepted: Orders
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
## 2019-04-01 LAB — HEPATITIS C ANTIBODY: Hep C Virus Ab: <0.1 s/co ratio (ref 0.0–0.9)

## 2019-04-01 LAB — HEP, RPR, HIV PANEL
HIV Screen 4th Generation wRfx: NONREACTIVE
Hepatitis B Surface Ag: NEGATIVE
RPR Ser Ql: NONREACTIVE

## 2019-04-04 LAB — CHLAMYDIA/GONOCOCCUS/TRICHOMONAS, NAA
Chlamydia by NAA: NEGATIVE
Gonococcus by NAA: NEGATIVE

## 2019-04-25 ENCOUNTER — Encounter: Payer: Self-pay | Admitting: *Deleted

## 2019-05-25 ENCOUNTER — Encounter: Payer: Self-pay | Admitting: Emergency Medicine

## 2019-05-25 ENCOUNTER — Ambulatory Visit
Admission: EM | Admit: 2019-05-25 | Discharge: 2019-05-25 | Disposition: A | Discharge status: Home / Self Care | Payer: BC Managed Care – PPO | Attending: Emergency Medicine | Admitting: Emergency Medicine

## 2019-05-25 ENCOUNTER — Other Ambulatory Visit: Payer: Self-pay

## 2019-05-25 DIAGNOSIS — R110 Nausea: Secondary | ICD-10-CM

## 2019-05-25 LAB — POCT URINE PREGNANCY: Preg Test, Ur: NEGATIVE

## 2019-05-25 MED ORDER — ONDANSETRON 4 MG PO TBDP
4.0000 mg | ORAL_TABLET | Freq: Once | ORAL | Status: AC
  Administered 2019-05-25: 4 mg via ORAL

## 2019-05-25 MED ORDER — PANTOPRAZOLE SODIUM 20 MG PO TBEC: 20.0000 mg | DELAYED_RELEASE_TABLET | Freq: Every day | ORAL | 0 refills | Status: DC

## 2019-05-25 MED ORDER — ONDANSETRON HCL 4 MG PO TABS: 4.0000 mg | ORAL_TABLET | Freq: Four times a day (QID) | ORAL | 0 refills | Status: DC

## 2019-05-25 NOTE — ED Notes (Signed)
Patient able to ambulate independently  

## 2019-05-25 NOTE — ED Triage Notes (Addendum)
Pt presents to [facility] for assessment of nausea starting after having two macha coffees from [place] after they made her order incorrect the first time.  Patient states 1 episode 2 weeks ago as well.  Patient states she has been having anxiety 3-4 weeks ("my therapist said that").  Pt's biggest concern is possible pregnancy, currently using Depo, due for next shot at the end of April.  Pt does not have cycles with injection.

## 2019-05-25 NOTE — ED Provider Notes (Signed)
[name]    CSN: [id number] Arrival date & time: 05/25/19  1648      History   Chief Complaint Chief Complaint  Patient presents with  . Nausea    HPI Kayla Ochoa is a 24 y.o. female with history of juvenile rheumatoid arthritis, asthma presenting for nausea without emesis that started this afternoon.  States that she drink 2 coffees from [employer] before developing nausea.  Patient also been having increased anxiety, for which she is followed by therapist.  Patient is largely concerned about possibly being pregnant.  States that she uses Depo, next shot due at the end of April.  LMP December 2020, last coitus January 2021.    Past Medical History:  Diagnosis Date  . Anxiety   . Asthma   . Depression   . Juvenile rheumatoid arthritis ([facility])   . Uveitis     Patient Active Problem List   Diagnosis Date Noted  . Cannabis use disorder, severe, dependence ([facility]) 01/08/2017  . Major depressive disorder, recurrent episode, severe, with psychosis ([facility]) 01/08/2017  . Major depressive disorder, recurrent, severe with psychotic features ([facility]) 01/08/2017  . Juvenile rheumatoid arthritis ([facility]) 11/17/2011    Past Surgical History:  Procedure Laterality Date  . TYMPANOSTOMY TUBE PLACEMENT Bilateral 2001  . WISDOM TOOTH EXTRACTION Bilateral     OB History    Gravida  0   Para  0   Term  0   Preterm  0   AB  0   Living  0     SAB  0   TAB  0   Ectopic  0   Multiple  0   Live Births  0            Home Medications    Prior to Admission medications   Medication Sig Start Date End Date Taking? Authorizing Provider  valACYclovir (VALTREX) 500 MG tablet Take one tablet po BID for 3 days if needed. Patient taking differently: Take 500 mg by mouth 2 (two) times daily. for 3 days if needed. 10/26/18  Yes Romualdo Bolk, MD  albuterol (PROVENTIL HFA;VENTOLIN HFA) 108 (90 Base) MCG/ACT inhaler Inhale 1-2 puffs into the lungs every 6 (six) hours  as needed for wheezing or shortness of breath. 03/04/17   Elson Areas, PA-C  EPINEPHrine 0.3 mg/0.3 mL IJ SOAJ injection Inject 0.3 mLs (0.3 mg total) into the muscle as needed for anaphylaxis. 04/08/18   Jerene Bears, MD  medroxyPROGESTERone Acetate 150 MG/ML SUSY Inject 150 mg into the muscle every 3 (three) months.  03/09/18   [provider]  ondansetron (ZOFRAN) 4 MG tablet Take 1 tablet (4 mg total) by mouth every 6 (six) hours. 05/25/19   Hall-Potvin, [place], PA-C  pantoprazole (PROTONIX) 20 MG tablet Take 1 tablet (20 mg total) by mouth daily. 05/25/19   Hall-Potvin, [place], PA-C  cetirizine (ZYRTEC) 10 MG tablet Take 1 tablet (10 mg total) by mouth daily. 04/08/18 05/25/19  Jerene Bears, MD    Family History Family History  Problem Relation Age of Onset  . Breast cancer Maternal Aunt   . Diabetes Paternal Aunt   . Schizophrenia Maternal Grandmother   . Diabetes Maternal Grandfather     Social History Social History   Tobacco Use  . Smoking status: Former Smoker    Types: Cigars  . Smokeless tobacco: Never Used  Substance Use Topics  . Alcohol use: No  . Drug use: Not on file  Comment: CBD     Allergies   Iodine, Fish allergy, Other, and Shrimp [shellfish allergy]   Review of Systems As per HPI   Physical Exam Triage Vital Signs ED Triage Vitals  Enc Vitals Group     BP 05/25/19 1654 120/76     Pulse Rate 05/25/19 1654 (!) 110     Resp 05/25/19 1654 18     Temp 05/25/19 1654 98.1 F (36.7 C)     Temp Source 05/25/19 1654 Temporal     SpO2 05/25/19 1654 99 %     Weight --      Height --      Head Circumference --      Peak Flow --      Pain Score 05/25/19 1656 0     Pain Loc --      Pain Edu? --      Excl. in GC? --    No data found.  Updated Vital Signs BP 120/76 (BP Location: Left Arm)   Pulse (!) 110   Temp 98.1 F (36.7 C) (Temporal)   Resp 18   SpO2 99%   Visual Acuity Right Eye Distance:   Left Eye Distance:     Bilateral Distance:    Right Eye Near:   Left Eye Near:    Bilateral Near:     Physical Exam Constitutional:      General: She is not in acute distress. HENT:     Head: Normocephalic and atraumatic.  Eyes:     General: No scleral icterus.    Pupils: Pupils are equal, round, and reactive to light.  Cardiovascular:     Rate and Rhythm: Normal rate.  Pulmonary:     Effort: Pulmonary effort is normal.  Skin:    Coloration: Skin is not jaundiced or pale.  Neurological:     Mental Status: She is alert and oriented to person, place, and time.      UC Treatments / Results  Labs (all labs ordered are listed, but only abnormal results are displayed) Labs Reviewed  POCT URINE PREGNANCY    EKG   Radiology No results found.  Procedures Procedures (including critical care time)  Medications Ordered in UC Medications  ondansetron (ZOFRAN-ODT) disintegrating tablet 4 mg (4 mg Oral Given 05/25/19 1701)    Initial Impression / Assessment and Plan / UC Course  I have reviewed the triage vital signs and the nursing notes.  Pertinent labs & imaging results that were available during my care of the patient were reviewed by me and considered in my medical decision making (see chart for details).     Patient febrile, nontoxic in office today.  Patient given Zofran ODT which he tolerated well.  Reporting mild improvement in nausea at time of discharge.  Urine pregnancy negative.  Patient verbalized understanding.  Nausea likely from increased caffeine and acidic beverage intake, though discussed possible GERD (patient does endorse h/o reflux) or cholelithiasis.  Return precautions discussed, patient verbalized understanding and is agreeable to plan. Final Clinical Impressions(s) / UC Diagnoses   Final diagnoses:  Nausea without vomiting     Discharge Instructions     Read food material and eat small meals throughout day. Follow up with primary care doctor for further testing  if symptoms persist. Go to ER sooner for worsening nausea, or you develop pain, vomiting, fever.    ED Prescriptions    Medication Sig Dispense Auth. Provider   pantoprazole (PROTONIX) 20 MG tablet Take 1 tablet (  20 mg total) by mouth daily. 30 tablet Hall-Potvin, [place], PA-C   ondansetron (ZOFRAN) 4 MG tablet Take 1 tablet (4 mg total) by mouth every 6 (six) hours. 12 tablet Hall-Potvin, [place], PA-C     PDMP not reviewed this encounter.   Neldon Mc [place], [place] 05/25/19 1731

## 2019-05-25 NOTE — Discharge Instructions (Addendum)
Read food material and eat small meals throughout day. Follow up with primary care doctor for further testing if symptoms persist. Go to ER sooner for worsening nausea, or you develop pain, vomiting, fever.

## 2019-06-22 ENCOUNTER — Ambulatory Visit: Payer: BC Managed Care – PPO | Admitting: Certified Nurse Midwife

## 2019-07-05 ENCOUNTER — Ambulatory Visit (INDEPENDENT_AMBULATORY_CARE_PROVIDER_SITE_OTHER): Payer: BC Managed Care – PPO | Admitting: Obstetrics

## 2019-07-05 ENCOUNTER — Encounter: Payer: Self-pay | Admitting: Obstetrics

## 2019-07-05 ENCOUNTER — Other Ambulatory Visit: Payer: Self-pay

## 2019-07-05 VITALS — BP 131/70 | HR 116 | Wt 130.1 lb

## 2019-07-05 DIAGNOSIS — Z30013 Encounter for initial prescription of injectable contraceptive: Secondary | ICD-10-CM | POA: Diagnosis not present

## 2019-07-05 DIAGNOSIS — Z3202 Encounter for pregnancy test, result negative: Secondary | ICD-10-CM | POA: Diagnosis not present

## 2019-07-05 DIAGNOSIS — Z3009 Encounter for other general counseling and advice on contraception: Secondary | ICD-10-CM

## 2019-07-05 LAB — POCT URINE PREGNANCY: Preg Test, Ur: NEGATIVE

## 2019-07-05 MED ORDER — MEDROXYPROGESTERONE ACETATE 150 MG/ML IM SUSP: 150.0000 mg | INTRAMUSCULAR | 4 refills | Status: DC

## 2019-07-05 NOTE — Progress Notes (Signed)
Pt dismissed from previous practice presents to continue Depo injections at this location. Last Depo 03/31/2019 - UPT neg

## 2019-07-05 NOTE — Progress Notes (Signed)
Subjective:    Kayla Ochoa is a 24 y.o. female who presents for contraception counseling. The patient has no complaints today. The patient is sexually active. Pertinent past medical history: current smoker.  The information documented in the HPI was reviewed and verified.  Menstrual History: OB History    Gravida  0   Para  0   Term  0   Preterm  0   AB  0   Living  0     SAB  0   TAB  0   Ectopic  0   Multiple  0   Live Births  0            No LMP recorded. Patient has had an injection.   Patient Active Problem List   Diagnosis Date Noted  . Cannabis use disorder, severe, dependence (HCC) 01/08/2017  . Major depressive disorder, recurrent episode, severe, with psychosis (HCC) 01/08/2017  . Major depressive disorder, recurrent, severe with psychotic features (HCC) 01/08/2017  . 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 Anxiety   . Asthma   . Depression   . Juvenile rheumatoid arthritis (HCC)   . Uveitis     Past Surgical History:  Procedure Laterality Date  . TYMPANOSTOMY TUBE PLACEMENT Bilateral 2001  . WISDOM TOOTH EXTRACTION Bilateral      Current Outpatient Medications:  .  albuterol (PROVENTIL HFA;VENTOLIN HFA) 108 (90 Base) MCG/ACT inhaler, Inhale 1-2 puffs into the lungs every 6 (six) hours as needed for wheezing or shortness of breath., Disp: 1 Inhaler, Rfl: 0 .  medroxyPROGESTERone Acetate 150 MG/ML SUSY, Inject 150 mg into the muscle every 3 (three) months. , Disp: , Rfl:  .  EPINEPHrine 0.3 mg/0.3 mL IJ SOAJ injection, Inject 0.3 mLs (0.3 mg total) into the muscle as needed for anaphylaxis. (Patient not taking: Reported on 07/05/2019), Disp: 1 Device, Rfl: 1 .  medroxyPROGESTERone (DEPO-PROVERA) 150 MG/ML injection, Inject 1 mL (150 mg total) into the muscle every 3 (three) months., Disp: 1 mL, Rfl: 4 .  ondansetron (ZOFRAN) 4 MG tablet, Take 1 tablet (4 mg total) by mouth every 6 (six) hours.  (Patient not taking: Reported on 07/05/2019), Disp: 12 tablet, Rfl: 0 .  pantoprazole (PROTONIX) 20 MG tablet, Take 1 tablet (20 mg total) by mouth daily. (Patient not taking: Reported on 07/05/2019), Disp: 30 tablet, Rfl: 0 .  valACYclovir (VALTREX) 500 MG tablet, Take one tablet po BID for 3 days if needed. (Patient not taking: Reported on 07/05/2019), Disp: 30 tablet, Rfl: 1 Allergies  Allergen Reactions  . Iodine Anaphylaxis  . Fish Allergy Diarrhea and Swelling    Swelling to face. Tested positive  . Other Swelling    Tree nuts Pecans strawberries  . Shrimp [Shellfish Allergy] Swelling    Social History   Tobacco Use  . Smoking status: Former Smoker    Types: Cigars  . Smokeless tobacco: Never Used  Substance Use Topics  . Alcohol use: No    Family History  Problem Relation Age of Onset  . Breast cancer Maternal Aunt   . Diabetes Paternal Aunt   . Schizophrenia Maternal Grandmother   . Diabetes Maternal Grandfather        Review of Systems Constitutional: negative for weight loss Genitourinary:negative for abnormal menstrual periods and vaginal discharge   Objective:   BP 131/70   Pulse (!) 116   Wt 130 lb 1.6 oz (59 kg)   BMI 24.58 kg/m  General:   alert  Skin:   no rash or abnormalities  Lungs:   clear to auscultation bilaterally  Heart:   regular rate and rhythm, S1, S2 normal, no murmur, click, rub or gallop   The remainder of the exam was deferred due to the nature of the encounter.    Lab Review Urine pregnancy test Labs reviewed yes Radiologic studies reviewed no  >50% of 15 min visit spent on counseling and coordination of care.    Assessment:    24 y.o., starting Depo-Provera injections, no contraindications.   Plan:   1. Encounter for other general counseling or advice on contraception - wants to continue Depo Provera injections  2. Encounter for initial prescription of injectable contraceptive Rx: - POCT urine pregnancy -  medroxyPROGESTERone (DEPO-PROVERA) 150 MG/ML injection; Inject 1 mL (150 mg total) into the muscle every 3 (three) months.  Dispense: 1 mL; Refill: 4    All questions answered. Contraception: Depo-Provera injections. Discussed healthy lifestyle modifications. Follow up in 1 year. Pregnancy test, result: negative.    Meds ordered this encounter  Medications  . medroxyPROGESTERone (DEPO-PROVERA) 150 MG/ML injection    Sig: Inject 1 mL (150 mg total) into the muscle every 3 (three) months.    Dispense:  1 mL    Refill:  4   Orders Placed This Encounter  Procedures  . POCT urine pregnancy    Shelly Bombard, MD 07/05/2019 11:04 AM

## 2019-07-06 ENCOUNTER — Ambulatory Visit (INDEPENDENT_AMBULATORY_CARE_PROVIDER_SITE_OTHER): Payer: BC Managed Care – PPO

## 2019-07-06 DIAGNOSIS — Z30013 Encounter for initial prescription of injectable contraceptive: Secondary | ICD-10-CM | POA: Diagnosis not present

## 2019-07-06 MED ORDER — MEDROXYPROGESTERONE ACETATE 150 MG/ML IM SUSP
150.0000 mg | Freq: Once | INTRAMUSCULAR | Status: AC
  Administered 2019-07-06: 150 mg via INTRAMUSCULAR

## 2019-07-06 NOTE — Progress Notes (Signed)
Kayla Ochoa is here for a depo injection. Pt tolerated injection well in the RD without complication. -EH/RMA

## 2019-07-06 NOTE — Progress Notes (Signed)
I have reviewed this chart and agree with the RN/CMA assessment and management.    K. Meryl Davis, M.D. Attending [facility] (Faculty Practice)   

## 2019-08-02 ENCOUNTER — Other Ambulatory Visit (HOSPITAL_COMMUNITY)
Admission: RE | Admit: 2019-08-02 | Discharge: 2019-08-02 | Disposition: A | Discharge status: Home / Self Care | Payer: BC Managed Care – PPO | Source: Ambulatory Visit | Attending: Obstetrics | Admitting: Obstetrics

## 2019-08-02 ENCOUNTER — Ambulatory Visit (INDEPENDENT_AMBULATORY_CARE_PROVIDER_SITE_OTHER): Payer: BC Managed Care – PPO

## 2019-08-02 ENCOUNTER — Other Ambulatory Visit: Payer: Self-pay

## 2019-08-02 DIAGNOSIS — N898 Other specified noninflammatory disorders of vagina: Secondary | ICD-10-CM | POA: Insufficient documentation

## 2019-08-02 NOTE — Progress Notes (Signed)
I have reviewed this chart and agree with the RN/CMA assessment and management.    K. Meryl Georgetta Crafton, M.D. Attending [facility] ([facility])   

## 2019-08-02 NOTE — Progress Notes (Signed)
Kayla Ochoa is here for self swab.  She reports having odor and vaginal discharge but she is not sure for how long.  No OTC meds. Results pending. -EH/RMA

## 2019-08-03 LAB — CERVICOVAGINAL ANCILLARY ONLY
Bacterial Vaginitis (gardnerella): POSITIVE — AB
Candida Glabrata: NEGATIVE
Candida Vaginitis: NEGATIVE
Chlamydia: NEGATIVE
Comment: NEGATIVE
Comment: NEGATIVE
Comment: NEGATIVE
Comment: NEGATIVE
Comment: NEGATIVE
Comment: NORMAL
Neisseria Gonorrhea: NEGATIVE
Trichomonas: NEGATIVE

## 2019-08-04 ENCOUNTER — Other Ambulatory Visit: Payer: Self-pay | Admitting: Obstetrics and Gynecology

## 2019-08-04 MED ORDER — METRONIDAZOLE 500 MG PO TABS: 500.0000 mg | ORAL_TABLET | Freq: Two times a day (BID) | ORAL | 0 refills | Status: DC

## 2019-08-17 ENCOUNTER — Other Ambulatory Visit: Payer: Self-pay

## 2019-08-17 ENCOUNTER — Emergency Department (HOSPITAL_COMMUNITY)
Admission: EM | Admit: 2019-08-17 | Discharge: 2019-08-18 | Disposition: A | Discharge status: Home / Self Care | Payer: BC Managed Care – PPO | Attending: Emergency Medicine | Admitting: Emergency Medicine

## 2019-08-17 ENCOUNTER — Encounter (HOSPITAL_COMMUNITY): Payer: Self-pay | Admitting: Emergency Medicine

## 2019-08-17 DIAGNOSIS — R0789 Other chest pain: Secondary | ICD-10-CM | POA: Diagnosis present

## 2019-08-17 DIAGNOSIS — Z5321 Procedure and treatment not carried out due to patient leaving prior to being seen by health care provider: Secondary | ICD-10-CM | POA: Diagnosis not present

## 2019-08-17 LAB — CBC
HCT: 40.7 % (ref 36.0–46.0)
Hemoglobin: 13 g/dL (ref 12.0–15.0)
MCH: 27.4 pg (ref 26.0–34.0)
MCHC: 31.9 g/dL (ref 30.0–36.0)
MCV: 85.7 fL (ref 80.0–100.0)
Platelets: 310 10*3/uL (ref 150–400)
RBC: 4.75 MIL/uL (ref 3.87–5.11)
RDW: 13.3 % (ref 11.5–15.5)
WBC: 7.4 10*3/uL (ref 4.0–10.5)
nRBC: 0 % (ref 0.0–0.2)

## 2019-08-17 LAB — I-STAT BETA HCG BLOOD, ED (MC, WL, AP ONLY): I-stat hCG, quantitative: <5 m[IU]/mL (ref <5)

## 2019-08-17 MED ORDER — SODIUM CHLORIDE 0.9% FLUSH: 3.0000 mL | Freq: Once | INTRAVENOUS | Status: DC

## 2019-08-17 NOTE — ED Triage Notes (Signed)
Patient reports central chest pain this evening , no SOB , denies emesis or diaphoresis , no cough or fever .  

## 2019-08-18 LAB — BASIC METABOLIC PANEL
Anion gap: 8 (ref 5–15)
BUN: 9 mg/dL (ref 6–20)
CO2: 26 mmol/L (ref 22–32)
Calcium: 10 mg/dL (ref 8.9–10.3)
Chloride: 105 mmol/L (ref 98–111)
Creatinine, Ser: 0.75 mg/dL (ref 0.44–1.00)
GFR calc Af Amer: >60 mL/min (ref >60)
GFR calc non Af Amer: >60 mL/min (ref >60)
Glucose, Bld: 97 mg/dL (ref 70–99)
Potassium: 3.8 mmol/L (ref 3.5–5.1)
Sodium: 139 mmol/L (ref 135–145)

## 2019-08-18 LAB — TROPONIN I (HIGH SENSITIVITY): Troponin I (High Sensitivity): <2 ng/L (ref <18)

## 2019-08-18 NOTE — ED Notes (Signed)
Pt stated she didn't want to wait and left

## 2019-09-28 ENCOUNTER — Other Ambulatory Visit: Payer: Self-pay

## 2019-09-28 ENCOUNTER — Ambulatory Visit (INDEPENDENT_AMBULATORY_CARE_PROVIDER_SITE_OTHER): Payer: BC Managed Care – PPO

## 2019-09-28 VITALS — BP 106/66 | HR 92 | Wt 134.0 lb

## 2019-09-28 DIAGNOSIS — Z3042 Encounter for surveillance of injectable contraceptive: Secondary | ICD-10-CM

## 2019-09-28 MED ORDER — MEDROXYPROGESTERONE ACETATE 150 MG/ML IM SUSP
150.0000 mg | Freq: Once | INTRAMUSCULAR | Status: AC
  Administered 2019-09-28: 150 mg via INTRAMUSCULAR

## 2019-09-28 NOTE — Progress Notes (Signed)
GYN presents for DEPO Injection, given in RD, tolerated well.  Next DEPO Oct. 20 - Nov. 04/2019  Administrations This Visit    medroxyPROGESTERone (DEPO-PROVERA) injection 150 mg    Admin Date 09/28/2019 Action Given Dose 150 mg Route Intramuscular Administered By Maretta Bees, RMA

## 2019-12-20 ENCOUNTER — Ambulatory Visit: Payer: BC Managed Care – PPO

## 2019-12-29 ENCOUNTER — Ambulatory Visit (INDEPENDENT_AMBULATORY_CARE_PROVIDER_SITE_OTHER): Payer: Medicaid Other

## 2019-12-29 ENCOUNTER — Ambulatory Visit: Payer: BC Managed Care – PPO

## 2019-12-29 ENCOUNTER — Other Ambulatory Visit: Payer: Self-pay

## 2019-12-29 DIAGNOSIS — Z3042 Encounter for surveillance of injectable contraceptive: Secondary | ICD-10-CM

## 2019-12-29 MED ORDER — MEDROXYPROGESTERONE ACETATE 150 MG/ML IM SUSP
150.0000 mg | Freq: Once | INTRAMUSCULAR | Status: AC
  Administered 2019-12-29: 150 mg via INTRAMUSCULAR

## 2019-12-29 NOTE — Progress Notes (Addendum)
Nurse visit for pt supply Depo Inj given LD without difficulty Next Depo due Jan 20-Feb 3, pt agrees   .Marland Kitchen Administrations This Visit    medroxyPROGESTERone (DEPO-PROVERA) injection 150 mg    Admin Date 12/29/2019 Action Given Dose 150 mg Route Intramuscular Administered By Lewayne Bunting, CMA

## 2019-12-29 NOTE — Progress Notes (Signed)
Patient was assessed and managed by nursing staff during this encounter. I have reviewed the chart and agree with the documentation and plan. I have also made any necessary editorial changes.  Warden Fillers, MD 12/29/2019 1:07 PM

## 2020-01-03 ENCOUNTER — Encounter: Payer: Self-pay | Admitting: Obstetrics & Gynecology

## 2020-02-07 ENCOUNTER — Ambulatory Visit: Payer: Medicaid Other | Admitting: Obstetrics and Gynecology

## 2020-02-22 ENCOUNTER — Other Ambulatory Visit: Payer: Self-pay

## 2020-02-22 ENCOUNTER — Ambulatory Visit (INDEPENDENT_AMBULATORY_CARE_PROVIDER_SITE_OTHER): Payer: Medicaid Other | Admitting: Obstetrics & Gynecology

## 2020-02-22 ENCOUNTER — Encounter: Payer: Self-pay | Admitting: Obstetrics & Gynecology

## 2020-02-22 ENCOUNTER — Other Ambulatory Visit (HOSPITAL_COMMUNITY)
Admission: RE | Admit: 2020-02-22 | Discharge: 2020-02-22 | Disposition: A | Discharge status: Home / Self Care | Payer: Medicaid Other | Source: Ambulatory Visit | Attending: Obstetrics & Gynecology | Admitting: Obstetrics & Gynecology

## 2020-02-22 VITALS — BP 118/80 | HR 105 | Ht 61.0 in | Wt 137.0 lb

## 2020-02-22 DIAGNOSIS — Z124 Encounter for screening for malignant neoplasm of cervix: Secondary | ICD-10-CM | POA: Diagnosis present

## 2020-02-22 DIAGNOSIS — Z113 Encounter for screening for infections with a predominantly sexual mode of transmission: Secondary | ICD-10-CM | POA: Insufficient documentation

## 2020-02-22 DIAGNOSIS — Z23 Encounter for immunization: Secondary | ICD-10-CM | POA: Diagnosis not present

## 2020-02-22 DIAGNOSIS — Z01419 Encounter for gynecological examination (general) (routine) without abnormal findings: Secondary | ICD-10-CM | POA: Diagnosis not present

## 2020-02-22 DIAGNOSIS — B009 Herpesviral infection, unspecified: Secondary | ICD-10-CM

## 2020-02-22 DIAGNOSIS — N898 Other specified noninflammatory disorders of vagina: Secondary | ICD-10-CM

## 2020-02-22 DIAGNOSIS — F1210 Cannabis abuse, uncomplicated: Secondary | ICD-10-CM

## 2020-02-22 DIAGNOSIS — M0800 Unspecified juvenile rheumatoid arthritis of unspecified site: Secondary | ICD-10-CM

## 2020-02-22 DIAGNOSIS — T7840XD Allergy, unspecified, subsequent encounter: Secondary | ICD-10-CM

## 2020-02-22 DIAGNOSIS — Z30013 Encounter for initial prescription of injectable contraceptive: Secondary | ICD-10-CM

## 2020-02-22 MED ORDER — MEDROXYPROGESTERONE ACETATE 150 MG/ML IM SUSP: 150.0000 mg | INTRAMUSCULAR | 4 refills | Status: DC

## 2020-02-22 MED ORDER — CETIRIZINE HCL 10 MG PO TABS: 10.0000 mg | ORAL_TABLET | Freq: Every day | ORAL | 4 refills | Status: DC

## 2020-02-22 MED ORDER — IBUPROFEN 800 MG PO TABS: 800.0000 mg | ORAL_TABLET | Freq: Three times a day (TID) | ORAL | 2 refills | Status: DC | PRN

## 2020-02-22 MED ORDER — VALACYCLOVIR HCL 500 MG PO TABS: ORAL_TABLET | ORAL | 4 refills | Status: DC

## 2020-02-22 NOTE — Progress Notes (Signed)
GYN presents for AEX/PAP/STD screening.  Patient is using DEPO for [facility], next DEPO due Jan. 20 - Feb. 3, 2022.  C/o insomnia.  PHQ-9=5

## 2020-02-22 NOTE — Progress Notes (Signed)
24 y.o. G0P0000 Single Black or [place] American female here for annual exam.  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  Has experienced at least 6 outbreaks.  Isn't on suppressive therapy.  D/w pt today.  Never finished Gardisil series.  D/w pt today.  She is willing to proceed.  Having a lot of joint pain.  Seeing opthalmology.    No LMP recorded. Patient has had an injection.          Sexually active: No.  The current method of family planning is Depo-Provera injections.    Smoker:  no   reports that she has quit smoking. Her smoking use included cigars. She has never used smokeless tobacco.  Drug: Other-see comments. She reports that she does not drink alcohol.  Past Medical History:  Diagnosis Date  . Anxiety   . Asthma   . Depression   . HSV infection   . Juvenile rheumatoid arthritis (HCC)   . Uveitis     Past Surgical History:  Procedure Laterality Date  . TYMPANOSTOMY TUBE PLACEMENT Bilateral 2001  . WISDOM TOOTH EXTRACTION Bilateral     Current Outpatient Medications  Medication Sig Dispense Refill  . medroxyPROGESTERone (DEPO-PROVERA) 150 MG/ML injection Inject 1 mL (150 mg total) into the muscle every 3 (three) months. 1 mL 4  . albuterol (PROVENTIL HFA;VENTOLIN HFA) 108 (90 Base) MCG/ACT inhaler Inhale 1-2 puffs into the lungs every 6 (six) hours as needed for wheezing or shortness of breath. 1 Inhaler 0  . EPINEPHrine 0.3 mg/0.3 mL IJ SOAJ injection Inject 0.3 mLs (0.3 mg total) into the muscle as needed for anaphylaxis. (Patient not taking: Reported on 07/05/2019) 1 Device 1  . medroxyPROGESTERone Acetate 150 MG/ML SUSY Inject 150 mg into the muscle every 3 (three) months.     . metroNIDAZOLE (FLAGYL) 500 MG tablet Take 1 tablet (500 mg total) by mouth 2 (two) times daily. (Patient not taking: Reported on 02/22/2020) 14 tablet 0  . ondansetron (ZOFRAN) 4 MG tablet Take 1 tablet (4 mg total) by mouth every 6 (six) hours. (Patient not taking: Reported on 07/05/2019) 12 tablet 0   . pantoprazole (PROTONIX) 20 MG tablet Take 1 tablet (20 mg total) by mouth daily. (Patient not taking: Reported on 07/05/2019) 30 tablet 0  . valACYclovir (VALTREX) 500 MG tablet Take one tablet po BID for 3 days if needed. (Patient not taking: Reported on 07/05/2019) 30 tablet 1   No current facility-administered medications for this visit.    Family History  Problem Relation Age of Onset  . Breast cancer Maternal Aunt   . Diabetes Paternal Aunt   . Schizophrenia Maternal Grandmother   . Diabetes Maternal Grandfather     Review of Systems  All other systems reviewed and are negative.   Exam:   BP 118/80   Pulse (!) 105   Ht 5\' 1"  (1.549 m)   Wt 137 lb (62.1 kg)   BMI 25.89 kg/m   Height: 5\' 1"  (154.9 cm)  General appearance: alert, cooperative and appears stated age Head: Normocephalic, without obvious abnormality, atraumatic Neck: no adenopathy, supple, symmetrical, trachea midline and thyroid normal to inspection and palpation Lungs: clear to auscultation bilaterally Breasts: normal appearance, no masses or tenderness, pt declined Heart: regular rate and rhythm Abdomen: soft, non-tender; bowel sounds normal; no masses,  no organomegaly Extremities: extremities normal, atraumatic, no cyanosis or edema Skin: Skin color, texture, turgor normal. No rashes or lesions Lymph nodes: Cervical, supraclavicular, and axillary nodes normal. No abnormal inguinal nodes  palpated Neurologic: Grossly normal   Pelvic: External genitalia:  no lesions              Urethra:  normal appearing urethra with no masses, tenderness or lesions              Bartholins and Skenes: normal                 Vagina: normal appearing vagina with normal color and discharge, no lesions              Cervix: no cervical motion tenderness, no lesions              Pap taken: Yes.   Bimanual Exam:  Uterus:  normal size, contour, position, consistency, mobility, non-tender              Adnexa: normal adnexa  and no mass, fullness, tenderness               Rectovaginal: Confirms               Anus:  normal sphincter tone, no lesions  Chaperone was present for exam.  Assessment/Plan: 1. Well woman exam with routine gynecological exam - Pap smear obtained today - Mammogram will be started around age 73   2. Screening examination for STD (sexually transmitted disease) - Cervicovaginal ancillary only( [place])  3. Vaginal discharge - Cervicovaginal ancillary only( [place])  4. HSV (herpes simplex virus) infection - valACYclovir (VALTREX) 500 MG tablet; 1 tab daily for suppression.  With new symptoms, take one tablet po BID for 3 days if needed.  Dispense: 90 tablet; Refill: 4  6. Juvenile rheumatoid arthritis (HCC) - Ambulatory referral to Rheumatology  7. Encounter for initial prescription of injectable contraceptive - medroxyPROGESTERone (DEPO-PROVERA) 150 MG/ML injection; Inject 1 mL (150 mg total) into the muscle every 3 (three) months.  Dispense: 1 mL; Refill: 4  8. Allergy, subsequent encounter - cetirizine (ZYRTEC) 10 MG tablet; Take 1 tablet (10 mg total) by mouth daily.  Dispense: 90 tablet; Refill: 4  9. Encounter for immunization -Pt never finished series.  Guidelines reviewed.  Will complete today. - HPV 9-valent vaccine,Recombinat

## 2020-02-23 LAB — CERVICOVAGINAL ANCILLARY ONLY
Bacterial Vaginitis (gardnerella): NEGATIVE
Candida Glabrata: NEGATIVE
Candida Vaginitis: NEGATIVE
Chlamydia: NEGATIVE
Comment: NEGATIVE
Comment: NEGATIVE
Comment: NEGATIVE
Comment: NEGATIVE
Comment: NEGATIVE
Comment: NORMAL
Neisseria Gonorrhea: NEGATIVE
Trichomonas: NEGATIVE

## 2020-02-23 LAB — CYTOLOGY - PAP: Diagnosis: NEGATIVE

## 2020-03-21 ENCOUNTER — Ambulatory Visit: Payer: Medicaid Other

## 2020-04-09 NOTE — Progress Notes (Deleted)
Office Visit Note  Patient: Kayla Ochoa             Date of Birth: 02-01-1996           MRN: [id number]             PCP: Lin Landsman, MD Referring: Megan Salon, MD Visit Date: 04/10/2020 Occupation: @GUAROCC @  Subjective:  No chief complaint on file.   History of Present Illness: Kayla Ochoa is a 25 y.o. female here for evaluation of JIA.   Previous records with [facility] rheumatology reviewed Original diagnosis and treatment with methotrexate [place], remicade, and intraocular kenalog injection. She was last seen 08/2019 without active joint disease seen at that time. Ophthalmology examinations have shown no inflammation since about 2019 off immunosuppression.***  Labs reviewed 08/2019 ANA neg ESR 10 CRP wnl  03/2019 HCV neg HBV neg HIV neg   Activities of Daily Living:  Patient reports morning stiffness for *** {minute/hour:19697}.   Patient {ACTIONS;DENIES/REPORTS:21021675::"Denies"} nocturnal pain.  Difficulty dressing/grooming: {ACTIONS;DENIES/REPORTS:21021675::"Denies"} Difficulty climbing stairs: {ACTIONS;DENIES/REPORTS:21021675::"Denies"} Difficulty getting out of chair: {ACTIONS;DENIES/REPORTS:21021675::"Denies"} Difficulty using hands for taps, buttons, cutlery, and/or writing: {ACTIONS;DENIES/REPORTS:21021675::"Denies"}  No Rheumatology ROS completed.   PMFS History:  Patient Active Problem List   Diagnosis Date Noted  . Cannabis use disorder, mild, abuse 02/22/2020  . Major depressive disorder, recurrent episode, severe, with psychosis ([place]) 01/08/2017  . Major depressive disorder, recurrent, severe with psychotic features ([place]) 01/08/2017  . Juvenile rheumatoid arthritis ([place]) 11/17/2011    Past Medical History:  Diagnosis Date  . Anxiety   . Asthma   . Depression   . HSV infection   . Juvenile rheumatoid arthritis ([place])   . Uveitis     Family History  Problem Relation Age of Onset  . Breast cancer Maternal Aunt   . Diabetes Paternal Aunt   .  Schizophrenia Maternal Grandmother   . Diabetes Maternal Grandfather    Past Surgical History:  Procedure Laterality Date  . TYMPANOSTOMY TUBE PLACEMENT Bilateral 2001  . WISDOM TOOTH EXTRACTION Bilateral    Social History   Social History Narrative  . Not on file   Immunization History  Administered Date(s) Administered  . HPV 9-valent 02/22/2020  . Hpv 02/05/2012, 08/08/2014  . Influenza,inj,Quad PF,6+ Mos 01/09/2017  . PFIZER(Purple Top)SARS-COV-2 Vaccination 10/26/2019, 11/30/2019  . Tdap 06/06/2009     Objective: Vital Signs: There were no vitals taken for this visit.   Physical Exam   Musculoskeletal Exam: ***  CDAI Exam: CDAI Score: -- Patient Global: --; Provider Global: -- Swollen: --; Tender: -- Joint Exam 04/10/2020   No joint exam has been documented for this visit   There is currently no information documented on the homunculus. Go to the Rheumatology activity and complete the homunculus joint exam.  Investigation: No additional findings.  Imaging: No results found.  Recent Labs: Lab Results  Component Value Date   WBC 7.4 08/17/2019   HGB 13.0 08/17/2019   PLT 310 08/17/2019   NA 139 08/17/2019   K 3.8 08/17/2019   CL 105 08/17/2019   CO2 26 08/17/2019   GLUCOSE 97 08/17/2019   BUN 9 08/17/2019   CREATININE 0.75 08/17/2019   BILITOT 0.5 01/22/2019   ALKPHOS 44 01/22/2019   AST 31 01/22/2019   ALT 55 (H) 01/22/2019   PROT 7.1 01/22/2019   ALBUMIN 4.0 01/22/2019   CALCIUM 10.0 08/17/2019   GFRAA >60 08/17/2019    Speciality Comments: No specialty comments available.  Procedures:  No procedures performed Allergies:  Iodine, Fish allergy, Other, and Shrimp [shellfish allergy]   Assessment / Plan:     Visit Diagnoses: No diagnosis found.  Orders: No orders of the defined types were placed in this encounter.  No orders of the defined types were placed in this encounter.   Face-to-face time spent with patient was *** minutes.  Greater than 50% of time was spent in counseling and coordination of care.  Follow-Up Instructions: No follow-ups on file.   Collier Salina, MD  Note - This record has been created using [employer].  Chart creation errors have been sought, but may not always  have been located. Such creation errors do not reflect on  the standard of medical care.

## 2020-04-10 ENCOUNTER — Ambulatory Visit: Payer: Medicaid Other | Admitting: Internal Medicine

## 2020-07-04 ENCOUNTER — Ambulatory Visit (INDEPENDENT_AMBULATORY_CARE_PROVIDER_SITE_OTHER): Payer: Commercial Managed Care - PPO

## 2020-07-04 ENCOUNTER — Other Ambulatory Visit: Payer: Self-pay

## 2020-07-04 DIAGNOSIS — Z3042 Encounter for surveillance of injectable contraceptive: Secondary | ICD-10-CM

## 2020-07-04 LAB — POCT URINE PREGNANCY: Preg Test, Ur: NEGATIVE

## 2020-07-04 NOTE — Progress Notes (Signed)
Kayla Ochoa presents today for 1st UPT to restart Depo. Last Depo was November 2021. She has no unusual complaints.  LMP: None since starting Depo    OBJECTIVE: Appears well, in no apparent distress.  OB History    Gravida  0   Para  0   Term  0   Preterm  0   AB  0   Living  0     SAB  0   IAB  0   Ectopic  0   Multiple  0   Live Births  0          Home UPT Result: n/a In-Office UPT result: Negative  I have reviewed the patient's medical, obstetrical, social, and family histories, and medications.   ASSESSMENT: Negative pregnancy test  PLAN Repeat UPT in 2 weeks, bring Depo rx If pregnancy is not desired, abstain from IC until after inj.

## 2020-07-05 NOTE — Progress Notes (Signed)
Patient was assessed and managed by nursing staff during this encounter. I have reviewed the chart and agree with the documentation and plan. I have also made any necessary editorial changes.  Catalina Antigua, MD 07/05/2020 11:43 AM

## 2020-07-19 ENCOUNTER — Ambulatory Visit: Payer: Medicaid Other

## 2020-07-25 ENCOUNTER — Ambulatory Visit (INDEPENDENT_AMBULATORY_CARE_PROVIDER_SITE_OTHER): Payer: Commercial Managed Care - PPO

## 2020-07-25 ENCOUNTER — Other Ambulatory Visit: Payer: Self-pay

## 2020-07-25 VITALS — BP 107/67 | HR 89 | Ht 61.0 in | Wt 133.0 lb

## 2020-07-25 DIAGNOSIS — Z3042 Encounter for surveillance of injectable contraceptive: Secondary | ICD-10-CM | POA: Diagnosis not present

## 2020-07-25 LAB — POCT URINE PREGNANCY: Preg Test, Ur: NEGATIVE

## 2020-07-25 MED ORDER — MEDROXYPROGESTERONE ACETATE 150 MG/ML IM SUSP
150.0000 mg | Freq: Once | INTRAMUSCULAR | Status: AC
  Administered 2020-07-25: 150 mg via INTRAMUSCULAR

## 2020-07-25 NOTE — Progress Notes (Signed)
Subjective:  Kayla Ochoa is a 25 y.o. female here for 2nd UPT and restart DEPO Injection.    Objective:  BP 107/67   Pulse 89   Ht 5\' 1"  (1.549 m)   Wt 133 lb (60.3 kg)   LMP 07/20/2020 (Exact Date)   BMI 25.13 kg/m    Appearance alert, well appearing, and in no distress. General exam BP noted to be well controlled today in office.    Assessment:   UPT today is NEGATIVE  Plan:  DEPO Injection given in LD, tolerated well. Next DEPO due Aug. 17-31, 2022  Administrations This Visit    medroxyPROGESTERone (DEPO-PROVERA) injection 150 mg    Admin Date 07/25/2020 Action Given Dose 150 mg Route Intramuscular Administered By 09/24/2020, RMA

## 2020-07-25 NOTE — Progress Notes (Signed)
Patient was assessed and managed by nursing staff during this encounter. I have reviewed the chart and agree with the documentation and plan. I have also made any necessary editorial changes.  Marylen Ponto, NP 07/25/2020 9:39 AM

## 2020-08-12 ENCOUNTER — Other Ambulatory Visit: Payer: Self-pay

## 2020-08-12 ENCOUNTER — Ambulatory Visit
Admission: EM | Admit: 2020-08-12 | Discharge: 2020-08-12 | Disposition: A | Discharge status: Home / Self Care | Payer: Commercial Managed Care - PPO

## 2020-08-12 ENCOUNTER — Emergency Department (HOSPITAL_BASED_OUTPATIENT_CLINIC_OR_DEPARTMENT_OTHER): Payer: Commercial Managed Care - PPO

## 2020-08-12 ENCOUNTER — Encounter (HOSPITAL_BASED_OUTPATIENT_CLINIC_OR_DEPARTMENT_OTHER): Payer: Self-pay

## 2020-08-12 ENCOUNTER — Emergency Department (HOSPITAL_BASED_OUTPATIENT_CLINIC_OR_DEPARTMENT_OTHER)
Admission: EM | Admit: 2020-08-12 | Discharge: 2020-08-12 | Disposition: A | Discharge status: Home / Self Care | Payer: Commercial Managed Care - PPO | Attending: Emergency Medicine | Admitting: Emergency Medicine

## 2020-08-12 DIAGNOSIS — S91202A Unspecified open wound of left great toe with damage to nail, initial encounter: Secondary | ICD-10-CM | POA: Diagnosis not present

## 2020-08-12 DIAGNOSIS — Y9301 Activity, walking, marching and hiking: Secondary | ICD-10-CM | POA: Diagnosis not present

## 2020-08-12 DIAGNOSIS — S91209A Unspecified open wound of unspecified toe(s) with damage to nail, initial encounter: Secondary | ICD-10-CM

## 2020-08-12 DIAGNOSIS — W228XXA Striking against or struck by other objects, initial encounter: Secondary | ICD-10-CM | POA: Diagnosis not present

## 2020-08-12 DIAGNOSIS — J45909 Unspecified asthma, uncomplicated: Secondary | ICD-10-CM | POA: Insufficient documentation

## 2020-08-12 DIAGNOSIS — Y92480 Sidewalk as the place of occurrence of the external cause: Secondary | ICD-10-CM | POA: Insufficient documentation

## 2020-08-12 DIAGNOSIS — Z87891 Personal history of nicotine dependence: Secondary | ICD-10-CM | POA: Diagnosis not present

## 2020-08-12 DIAGNOSIS — S99922A Unspecified injury of left foot, initial encounter: Secondary | ICD-10-CM | POA: Diagnosis present

## 2020-08-12 NOTE — ED Notes (Signed)
Dc instructions given to patient, stated understanding.

## 2020-08-12 NOTE — Discharge Instructions (Addendum)
Advised follow up in ED

## 2020-08-12 NOTE — ED Notes (Addendum)
Patient is being discharged from the Urgent Care and sent to the Emergency Department via POV . Per Zanetto, PA patient is in need of higher level of care due to nail injury/removal. Patient is aware and verbalizes understanding of plan of care.  Vitals:   08/12/20 1433 08/12/20 1434  BP: 128/75 115/77  Pulse: 83 89  Resp: 16 16  Temp: 99.2 F (37.3 C) 97.9 F (36.6 C)  SpO2: 95% 99%

## 2020-08-12 NOTE — ED Triage Notes (Signed)
Patient presents to Urgent Care with complaints of left toe injury from a trip and fall. She has an acrylic nail on her toe that she hit against the side walk. She is unsure if her nail has lifted from her nail bud and has to be removed.

## 2020-08-12 NOTE — ED Triage Notes (Signed)
Pt came in for right big toe injury   States she hit her foot to an uneven pavement while walking at sidewalk at 1200 today   Last tetanus - unknown

## 2020-08-12 NOTE — ED Provider Notes (Signed)
[name]    CSN: [id number] Arrival date & time: 08/12/20  1356      History   Chief Complaint Chief Complaint  Patient presents with   Foot Pain    Left foot     HPI Kayla Ochoa is a 25 y.o. female.   Pt presents with partial nail avulsion to left great toe that occurred earlier today.  Pt has acrylic nails over her natural nails.  His the toe on a step earlier today.  Denies any other injuries.    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 Authorizing Provider  albuterol (PROVENTIL HFA;VENTOLIN HFA) 108 (90 Base) MCG/ACT inhaler Inhale 1-2 puffs into the lungs every 6 (six) hours as needed for wheezing or shortness of breath. Patient not taking: Reported on 07/04/2020 03/04/17   Elson Areas, PA-C  cetirizine (ZYRTEC) 10 MG tablet Take 1 tablet (10 mg total) by mouth daily. Patient not taking: Reported on 07/04/2020 02/22/20   Jerene Bears, MD  EPINEPHrine 0.3 mg/0.3 mL IJ SOAJ injection Inject 0.3 mLs (0.3 mg total) into the muscle as needed for anaphylaxis. Patient not taking: No sig reported 04/08/18   Jerene Bears, MD  ibuprofen (ADVIL) 800 MG tablet Take 1 tablet (800 mg total) by mouth every 8 (eight) hours as needed. 02/22/20   Jerene Bears, MD  medroxyPROGESTERone (DEPO-PROVERA) 150 MG/ML injection Inject 1 mL (150 mg total) into the muscle every 3 (three) months. Patient not taking: Reported on 07/04/2020 02/22/20   Jerene Bears, MD  metroNIDAZOLE (FLAGYL) 500 MG tablet Take 1 tablet (500 mg total) by mouth 2 (two) times daily. Patient not taking: No sig reported 08/04/19   Conan Bowens, MD  ondansetron (ZOFRAN) 4 MG tablet Take 1 tablet (4 mg total) by mouth every 6 (six) hours. Patient not taking: No sig reported 05/25/19   Hall-Potvin, [place], PA-C  pantoprazole (PROTONIX) 20 MG tablet Take 1 tablet (20 mg total) by mouth daily. Patient not taking: No sig reported 05/25/19   Hall-Potvin, [place], PA-C  valACYclovir (VALTREX) 500 MG tablet 1 tab daily for suppression.  With new symptoms, take one tablet po BID for 3 days if needed. Patient taking differently: 1 tab daily for suppression.  With new symptoms, take one tablet po BID for 3 days if needed. 02/22/20  Jerene Bears, MD    Family History Family History  Problem Relation Age of Onset   Breast cancer Maternal Aunt    Diabetes Paternal Aunt    Schizophrenia Maternal Grandmother    Diabetes Maternal Grandfather     Social History Social History   Tobacco Use   Smoking status: Former    Pack years: 0.00    Types: Cigars   Smokeless tobacco: Never  Vaping Use   Vaping Use: Never used  Substance Use Topics   Alcohol use: No     Allergies   Iodine, Fish allergy, Other, and Shrimp [shellfish allergy]   Review of Systems Review of Systems  Constitutional:  Negative for chills and fever.  HENT:  Negative for ear pain and sore throat.   Eyes:  Negative for pain and visual disturbance.  Respiratory:  Negative for cough and shortness of breath.   Cardiovascular:  Negative for chest pain and palpitations.   Gastrointestinal:  Negative for abdominal pain and vomiting.  Genitourinary:  Negative for dysuria and hematuria.  Musculoskeletal:  Negative for arthralgias and back pain.  Skin:  Positive for wound (left great toe partial avulsion). Negative for color change and rash.  Neurological:  Negative for seizures and syncope.  All other systems reviewed and are negative.   Physical Exam Triage Vital Signs ED Triage Vitals  Enc Vitals Group     BP 08/12/20 1433 128/75     Pulse Rate 08/12/20 1433 83     Resp 08/12/20 1433 16     Temp 08/12/20 1433 99.2 F (37.3 C)     Temp Source 08/12/20 1433 Oral     SpO2 08/12/20 1433 95 %     Weight --      Height --      Head Circumference --      Peak Flow --      Pain Score 08/12/20 1448 0     Pain Loc --      Pain Edu? --      Excl. in GC? --    No data found.  Updated Vital Signs BP 115/77 (BP Location: Left Arm)   Pulse 89   Temp 97.9 F (36.6 C) (Oral)   Resp 16   LMP 07/20/2020 (Exact Date)   SpO2 99%   Visual Acuity Right Eye Distance:   Left Eye Distance:   Bilateral Distance:    Right Eye Near:   Left Eye Near:    Bilateral Near:     Physical Exam Vitals and nursing note reviewed.  Constitutional:      General: She is not in acute distress.    Appearance: She is well-developed.  HENT:     Head: Normocephalic and atraumatic.  Eyes:     Conjunctiva/sclera: Conjunctivae normal.  Cardiovascular:     Rate and Rhythm: Normal rate and regular rhythm.     Heart sounds: No murmur heard. Pulmonary:     Effort: Pulmonary effort is normal. No respiratory distress.     Breath sounds: Normal breath sounds.  Abdominal:     Palpations: Abdomen is soft.     Tenderness: There is no abdominal tenderness.  Musculoskeletal:     Cervical back: Neck supple.       Feet:  Skin:    General: Skin is warm and dry.  Neurological:     Mental Status: She is alert.     UC Treatments / Results  Labs (all labs ordered are  listed, but  only abnormal results are displayed) Labs Reviewed - No data to display  EKG   Radiology No results found.  Procedures Procedures (including critical care time)  Medications Ordered in UC Medications - No data to display  Initial Impression / Assessment and Plan / UC Course  I have reviewed the triage vital signs and the nursing notes.  Pertinent labs & imaging results that were available during my care of the patient were reviewed by me and considered in my medical decision making (see chart for details).     Advised pt to follow up in the ED for possible removal of toenail due to partial avulsion.  Pt is very nervous about having nail removed or cut down.  With acrylic nail on top, patient's anxiety about the procedure, and lack of proper tools here in clinic recommend evaluation in the ED.  Final Clinical Impressions(s) / UC Diagnoses   Final diagnoses:  None   Discharge Instructions   None    ED Prescriptions   None    PDMP not reviewed this encounter.   Jodell Cipro, PA-C 08/12/20 1520

## 2020-08-13 NOTE — ED Provider Notes (Signed)
[facility] EMERGENCY DEPT Provider Note   CSN: [id number] Arrival date & time: 08/12/20  1534     History Chief Complaint  Patient presents with   Toe Injury    Kayla Ochoa is a 25 y.o. female.  HPI     24yo female with history of juvenile rheuatoid arthritis, MDD, MDD presents with concern for partial avulsion.   Was walking and hit foot on uneven pavement.  Occurred at noon today. Recently had acrylic nails done--nail lifted upward. Pain over area worse with walking. Was seen at urgent care and sent here.   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    Past Surgical History:  Procedure Laterality Date   TYMPANOSTOMY TUBE PLACEMENT Bilateral 2001   WISDOM TOOTH EXTRACTION Bilateral      OB History     Gravida  0   Para  0   Term  0   Preterm  0   AB  0   Living  0      SAB  0   IAB  0   Ectopic  0   Multiple  0   Live Births  0           Family History  Problem Relation Age of Onset   Breast cancer Maternal Aunt    Diabetes Paternal Aunt    Schizophrenia Maternal Grandmother    Diabetes Maternal Grandfather     Social History   Tobacco Use   Smoking status: Former    Pack years: 0.00    Types: Cigars   Smokeless tobacco: Never  Vaping Use   Vaping Use: Never used  Substance Use Topics   Alcohol use: No    Home Medications Prior to Admission medications   Medication Sig Start Date End Date Taking? Authorizing Provider  albuterol (PROVENTIL HFA;VENTOLIN HFA) 108 (90 Base) MCG/ACT  inhaler Inhale 1-2 puffs into the lungs every 6 (six) hours as needed for wheezing or shortness of breath. Patient not taking: Reported on 07/04/2020 03/04/17   Elson Areas, PA-C  cetirizine (ZYRTEC) 10 MG tablet Take 1 tablet (10 mg total) by mouth daily. Patient not taking: Reported on 07/04/2020 02/22/20   Jerene Bears, MD  EPINEPHrine 0.3 mg/0.3 mL IJ SOAJ injection Inject 0.3 mLs (0.3 mg total) into the muscle as needed for anaphylaxis. Patient not taking: No sig reported 04/08/18   Jerene Bears, MD  ibuprofen (ADVIL) 800 MG tablet Take 1 tablet (800 mg total) by mouth every 8 (eight) hours as needed. 02/22/20   Jerene Bears, MD  medroxyPROGESTERone (DEPO-PROVERA) 150 MG/ML injection Inject 1 mL (150 mg total) into the muscle every 3 (three) months. Patient not taking: Reported on 07/04/2020 02/22/20   Jerene Bears, MD  metroNIDAZOLE (FLAGYL) 500 MG tablet Take 1 tablet (500 mg total) by mouth 2 (two) times daily. Patient not taking: No sig reported 08/04/19   Conan Bowens, MD  ondansetron (ZOFRAN) 4 MG tablet Take 1 tablet (4 mg total) by mouth every 6 (six)  hours. Patient not taking: No sig reported 05/25/19   Hall-Potvin, [place], PA-C  pantoprazole (PROTONIX) 20 MG tablet Take 1 tablet (20 mg total) by mouth daily. Patient not taking: No sig reported 05/25/19   Hall-Potvin, [place], PA-C  valACYclovir (VALTREX) 500 MG tablet 1 tab daily for suppression.  With new symptoms, take one tablet po BID for 3 days if needed. Patient taking differently: 1 tab daily for suppression.  With new symptoms, take one tablet po BID for 3 days if needed. 02/22/20   Jerene Bears, MD    Allergies    Iodine, Fish allergy, Other, and Shrimp [shellfish allergy]  Review of Systems   Review of Systems  Constitutional:  Negative for fever.  Musculoskeletal:  Positive for arthralgias.   Physical Exam Updated Vital Signs BP 123/76 (BP Location: Right Arm)   Pulse 96   Temp 97.9 F (36.6 C)  (Oral)   Resp 18   Ht 5\' 1"  (1.549 m)   Wt 60.3 kg   LMP 07/20/2020 (Exact Date)   SpO2 100%   BMI 25.13 kg/m   Physical Exam Vitals and nursing note reviewed.  Constitutional:      General: She is not in acute distress.    Appearance: Normal appearance. She is not ill-appearing, toxic-appearing or diaphoretic.  HENT:     Head: Normocephalic.  Eyes:     Conjunctiva/sclera: Conjunctivae normal.  Cardiovascular:     Rate and Rhythm: Normal rate and regular rhythm.     Pulses: Normal pulses.  Pulmonary:     Effort: Pulmonary effort is normal. No respiratory distress.  Musculoskeletal:        General: No deformity or signs of injury.     Cervical back: No rigidity.  Skin:    General: Skin is warm and dry.     Coloration: Skin is not jaundiced or pale.  Neurological:     General: No focal deficit present.     Mental Status: She is alert and oriented to person, place, and time.    ED Results / Procedures / Treatments   Labs (all labs ordered are listed, but only abnormal results are displayed) Labs Reviewed - No data to display  EKG None  Radiology DG Toe Great Left  Result Date: 08/12/2020 CLINICAL DATA:  Left great toe pain. EXAM: LEFT GREAT TOE COMPARISON:  None. FINDINGS: There is no evidence of fracture or dislocation. There is no evidence of arthropathy or other focal bone abnormality. Apparent separation of the nail. IMPRESSION: 1. No acute fracture or dislocation identified about the left great toe. 2. Apparent separation of the nail. Electronically Signed   By: 08/14/2020 M.D.   On: 08/12/2020 18:49    Procedures Procedures   Medications Ordered in ED Medications - No data to display  ED Course  I have reviewed the triage vital signs and the nursing notes.  Pertinent labs & imaging results that were available during my care of the patient were reviewed by me and considered in my medical decision making (see chart for details).    MDM  Rules/Calculators/A&P                           24yo female with history of juvenile rheuatoid arthritis, MDD, MDD presents with concern for partial avulsion.  XR shows no acute fracture. No significant laceration.  Nail is lifted from matrix but still in place under fold and do not feel nail removal  is indicated at this time.  Recommend dressing 4x4, coban, podiatry follow up.    Final Clinical Impression(s) / ED Diagnoses Final diagnoses:  Avulsion of toenail, initial encounter    Rx / DC Orders ED Discharge Orders     None        Alvira Monday, MD 08/13/20 1231

## 2020-08-15 ENCOUNTER — Ambulatory Visit (INDEPENDENT_AMBULATORY_CARE_PROVIDER_SITE_OTHER): Payer: Commercial Managed Care - PPO | Admitting: Surgery

## 2020-08-15 ENCOUNTER — Other Ambulatory Visit: Payer: Self-pay

## 2020-08-15 ENCOUNTER — Encounter: Payer: Self-pay | Admitting: Surgery

## 2020-08-15 DIAGNOSIS — S91209A Unspecified open wound of unspecified toe(s) with damage to nail, initial encounter: Secondary | ICD-10-CM | POA: Diagnosis not present

## 2020-08-22 ENCOUNTER — Encounter: Payer: Self-pay | Admitting: Orthopaedic Surgery

## 2020-08-22 ENCOUNTER — Ambulatory Visit (INDEPENDENT_AMBULATORY_CARE_PROVIDER_SITE_OTHER): Payer: Commercial Managed Care - PPO | Admitting: Orthopaedic Surgery

## 2020-08-22 DIAGNOSIS — S91209A Unspecified open wound of unspecified toe(s) with damage to nail, initial encounter: Secondary | ICD-10-CM

## 2020-08-22 NOTE — Progress Notes (Signed)
Office Visit Note   Patient: Kayla Ochoa           Date of Birth: 03/23/1995           MRN: [id number] Visit Date: 08/22/2020              Requested by: Leilani Able, MD [address] [place],  [place] [address] PCP: Leilani Able, MD   Assessment & Plan: Visit Diagnoses:  1. Traumatic avulsion of nail plate of toe, initial encounter     Plan: New Band-Aid applied.  She understands that she may lose the toenail.  She is lost her third toenail in the past and it grew back without problems.  We discussed protection so she does not catch it on something particular if she is wearing sandals.  No infection noted she will call if she has persistent problems.  Follow-Up Instructions: No follow-ups on file.   Orders:  No orders of the defined types were placed in this encounter.  No orders of the defined types were placed in this encounter.     Procedures: No procedures performed   Clinical Data: No additional findings.   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  Patient had acrylic toenail treatment in the past and had elevation of the great toenail with x-rays obtained at [facility] showing no acute fracture of the left great toenail.  Patient has been wearing a Band-Aid over the toenail to keep it pushed down.  She is using regular shoes denies any significant pain no purulent drainage.  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  Review of Systems positive for diagnosis of JRA.   Objective: Vital Signs: Ht 5\' 1"  (1.549 m)   Wt 133 lb (60.3 kg)   BMI 25.13 kg/m   Physical Exam Constitutional:      Appearance: She is well-developed.  HENT:     Head: Normocephalic.     Right Ear: External ear normal.      Left Ear: External ear normal. There is no impacted cerumen.  Eyes:     Pupils: Pupils are equal, round, and reactive to light.  Neck:     Thyroid: No thyromegaly.     Trachea: No tracheal deviation.  Cardiovascular:     Rate and Rhythm: Normal rate.  Pulmonary:     Effort: Pulmonary effort is normal.  Abdominal:     Palpations: Abdomen is soft.  Musculoskeletal:     Cervical back: No rigidity.  Skin:    General: Skin is warm and dry.  Neurological:     Mental Status: She is alert and oriented to person, place, and time.  Psychiatric:        Behavior: Behavior normal.    Ortho Exam slight elevation of the nail trace serous drainage from around the eponychium.  She has acrylic painting of the nail so unable to determine subungual hematoma.  No blood present with pressing down on the nail plate.  She is able to flex and extend her toe.  Specialty Comments:  No specialty comments available.  Imaging: No results found.   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   Past Medical History:  Diagnosis Date   Anxiety    Asthma    Depression    HSV infection    Juvenile rheumatoid arthritis (HCC)    Uveitis     Family History  Problem Relation Age of Onset   Breast cancer Maternal Aunt    Diabetes Paternal Aunt    Schizophrenia Maternal Grandmother    Diabetes Maternal Grandfather     Past Surgical History:  Procedure Laterality Date   TYMPANOSTOMY TUBE PLACEMENT Bilateral 2001   WISDOM TOOTH EXTRACTION Bilateral    Social History   Occupational History   Not on file  Tobacco Use   Smoking status: Former    Pack  years: 0.00    Types: Cigars   Smokeless tobacco: Never  Vaping Use   Vaping Use: Never used  Substance and Sexual Activity   Alcohol use: No   Drug use: Not on file    Comment: CBD   Sexual activity: Yes    Partners: Male    Birth control/protection: Injection    Comment: depo

## 2020-08-23 DIAGNOSIS — S91209A Unspecified open wound of unspecified toe(s) with damage to nail, initial encounter: Secondary | ICD-10-CM | POA: Insufficient documentation

## 2020-09-04 NOTE — Progress Notes (Signed)
   Office Visit Note   Patient: Kayla Ochoa           Date of Birth: [birth year]           MRN: [id number] Visit Date: 08/15/2020              Requested by: Leilani Able, MD [address] [place],  [place] [address] PCP: Leilani Able, MD   Assessment & Plan: Visit Diagnoses:  1. Avulsion of toenail, initial encounter     Plan: Today I do is put a Band-Aid around the toenail to help keep this in place.  She will continue postop shoe.  Follow-up with Dr. Ophelia Charter in 1 week for recheck.  Follow-Up Instructions: Return in about 1 week (around 08/22/2020) for with dr yates for recheck.   Orders:  No orders of the defined types were placed in this encounter.  No orders of the defined types were placed in this encounter.     Procedures: No procedures performed   Clinical Data: No additional findings.   Subjective: Chief Complaint  Patient presents with   Left Great Toe - Injury    08/12/20 hit toe on uneven pavement    Injury   Review of Systems No current cardiac pulmonary GI GU issues  Objective: Vital Signs: LMP 07/20/2020 (Exact Date)   Physical Exam HENT:     Head: Normocephalic.     Nose: Nose normal.  Eyes:     Extraocular Movements: Extraocular movements intact.  Pulmonary:     Effort: No respiratory distress.  Musculoskeletal:     Comments: Exam distal left great toe is tender.  No signs of infection.  Toenail is slightly unstable but attached.  Skin:    General: Skin is warm and dry.  Neurological:     Mental Status: She is alert and oriented to person, place, and time.    Ortho Exam  Specialty Comments:  No specialty comments available.  Imaging: No results found.   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/08/2017   Major depressive  disorder, recurrent, severe with psychotic features (HCC) 01/08/2017   Juvenile rheumatoid arthritis (HCC) 11/17/2011   Past Medical History:  Diagnosis Date   Anxiety    Asthma    Depression    HSV infection    Juvenile rheumatoid arthritis (HCC)    Uveitis     Family History  Problem Relation Age of Onset   Breast cancer Maternal Aunt    Diabetes Paternal Aunt    Schizophrenia Maternal Grandmother    Diabetes Maternal Grandfather     Past Surgical History:  Procedure Laterality Date   TYMPANOSTOMY TUBE PLACEMENT Bilateral 2001   WISDOM TOOTH EXTRACTION Bilateral    Social History   Occupational History   Not on file  Tobacco Use   Smoking status: Former    Pack years: 0.00    Types: Cigars   Smokeless tobacco: Never  Vaping Use   Vaping Use: Never used  Substance and Sexual Activity   Alcohol use: No   Drug use: Not on file    Comment: CBD   Sexual activity: Yes    Partners: Male    Birth control/protection: Injection    Comment: depo

## 2020-10-15 ENCOUNTER — Ambulatory Visit (INDEPENDENT_AMBULATORY_CARE_PROVIDER_SITE_OTHER): Payer: Commercial Managed Care - PPO

## 2020-10-15 ENCOUNTER — Other Ambulatory Visit: Payer: Self-pay

## 2020-10-15 DIAGNOSIS — Z3042 Encounter for surveillance of injectable contraceptive: Secondary | ICD-10-CM | POA: Diagnosis not present

## 2020-10-15 MED ORDER — MEDROXYPROGESTERONE ACETATE 150 MG/ML IM SUSP
150.0000 mg | Freq: Once | INTRAMUSCULAR | Status: AC
  Administered 2020-10-15: 150 mg via INTRAMUSCULAR

## 2020-10-15 NOTE — Progress Notes (Signed)
Patient presents for Depo inj. Patient is within her window. Inj given in RD. Patient tolerated well. Next Depo due Nov 7- Nov 22.

## 2020-11-21 ENCOUNTER — Other Ambulatory Visit: Payer: Self-pay

## 2020-11-21 ENCOUNTER — Ambulatory Visit
Admission: EM | Admit: 2020-11-21 | Discharge: 2020-11-21 | Disposition: A | Discharge status: Home / Self Care | Payer: Medicaid Other | Attending: Internal Medicine | Admitting: Internal Medicine

## 2020-11-21 DIAGNOSIS — J039 Acute tonsillitis, unspecified: Secondary | ICD-10-CM | POA: Insufficient documentation

## 2020-11-21 DIAGNOSIS — J029 Acute pharyngitis, unspecified: Secondary | ICD-10-CM | POA: Diagnosis present

## 2020-11-21 DIAGNOSIS — Z20822 Contact with and (suspected) exposure to covid-19: Secondary | ICD-10-CM | POA: Diagnosis present

## 2020-11-21 LAB — POCT RAPID STREP A (OFFICE): Rapid Strep A Screen: NEGATIVE

## 2020-11-21 MED ORDER — GUAIFENESIN 200 MG PO TABS: 200.0000 mg | ORAL_TABLET | ORAL | 0 refills | Status: DC | PRN

## 2020-11-21 MED ORDER — AMOXICILLIN 875 MG PO TABS: 875.0000 mg | ORAL_TABLET | Freq: Two times a day (BID) | ORAL | 0 refills | Status: AC

## 2020-11-21 NOTE — ED Provider Notes (Signed)
[name]    CSN: [id number] Arrival date & time: 11/21/20  0951      History   Chief Complaint Chief Complaint  Patient presents with   Cough    HPI Kayla Ochoa is a 25 y.o. female.   Patient presents with cough, nasal congestion, chills, body aches, sore throat that started last night.  Patient not sure if fever is present.  Mother has similar symptoms.  Denies chest pain or shortness of breath.  Has used cetirizine, lemon tea, Hall's cough drops over-the-counter with minimal improvement in symptoms.  Denies chest pain or shortness of breath.   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 Authorizing Provider  amoxicillin (AMOXIL) 875 MG tablet Take 1 tablet (875 mg total) by mouth 2 (two) times daily for 10 days. 11/21/20 12/01/20 Yes Lance Muss, FNP  guaiFENesin 200 MG tablet Take 1 tablet (200 mg total) by mouth every 4 (four) hours as needed for cough or to loosen phlegm. 11/21/20  Yes Lance Muss, FNP  albuterol (PROVENTIL HFA;VENTOLIN HFA) 108  (90 Base) MCG/ACT inhaler Inhale 1-2 puffs into the lungs every 6 (six) hours as needed for wheezing or shortness of breath. Patient not taking: No sig reported 03/04/17   Elson Areas, PA-C  cetirizine (ZYRTEC) 10 MG tablet Take 1 tablet (10 mg total) by mouth daily. Patient not taking: No sig reported 02/22/20   Jerene Bears, MD  EPINEPHrine 0.3 mg/0.3 mL IJ SOAJ injection Inject 0.3 mLs (0.3 mg total) into the muscle as needed for anaphylaxis. Patient not taking: No sig reported 04/08/18   Jerene Bears, MD  ibuprofen (ADVIL) 800 MG tablet Take 1 tablet (800 mg total) by mouth every 8 (eight) hours as needed. Patient not taking: Reported on 08/22/2020 02/22/20   Jerene Bears, MD  medroxyPROGESTERone (DEPO-PROVERA) 150 MG/ML injection Inject 1 mL (150 mg total) into the muscle every 3 (three) months. Patient not taking: No sig reported 02/22/20   Jerene Bears, MD  metroNIDAZOLE (FLAGYL) 500 MG tablet Take 1 tablet (500 mg total) by mouth 2 (two) times daily. Patient not taking: No sig reported 08/04/19   Conan Bowens, MD  ondansetron (ZOFRAN) 4 MG tablet Take 1 tablet (4 mg total) by mouth  every 6 (six) hours. Patient not taking: No sig reported 05/25/19   Hall-Potvin, [place], PA-C  pantoprazole (PROTONIX) 20 MG tablet Take 1 tablet (20 mg total) by mouth daily. Patient not taking: No sig reported 05/25/19   Hall-Potvin, [place], PA-C  valACYclovir (VALTREX) 500 MG tablet 1 tab daily for suppression.  With new symptoms, take one tablet po BID for 3 days if needed. Patient taking differently: 1 tab daily for suppression.  With new symptoms, take one tablet po BID for 3 days if needed. 02/22/20   Jerene Bears, MD    Family History Family History  Problem Relation Age of Onset   Breast cancer Maternal Aunt    Diabetes Paternal Aunt    Schizophrenia Maternal Grandmother    Diabetes Maternal Grandfather     Social History Social History   Tobacco Use   Smoking status: Former     Types: Cigars   Smokeless tobacco: Never  Vaping Use   Vaping Use: Never used  Substance Use Topics   Alcohol use: No     Allergies   Iodine, Fish allergy, Other, and Shrimp [shellfish allergy]   Review of Systems Review of Systems Per HPI  Physical Exam Triage Vital Signs ED Triage Vitals  Enc Vitals Group     BP 11/21/20 1103 122/77     Pulse Rate 11/21/20 1103 (!) 116     Resp 11/21/20 1103 18     Temp 11/21/20 1103 99.8 F (37.7 C)     Temp Source 11/21/20 1103 Oral     SpO2 11/21/20 1103 98 %     Weight --      Height --      Head Circumference --      Peak Flow --      Pain Score 11/21/20 1104 6     Pain Loc --      Pain Edu? --      Excl. in [place]? --    No data found.  Updated Vital Signs BP 122/77 (BP Location: Left Arm)   Pulse (!) 116   Temp 99.8 F (37.7 C) (Oral)   Resp 18   SpO2 98%   Visual Acuity Right Eye Distance:   Left Eye Distance:   Bilateral Distance:    Right Eye Near:   Left Eye Near:    Bilateral Near:     Physical Exam Constitutional:      General: She is not in acute distress.    Appearance: Normal appearance. She is not toxic-appearing or diaphoretic.  HENT:     Head: Normocephalic and atraumatic.     Right Ear: Tympanic membrane and ear canal normal.     Left Ear: Tympanic membrane and ear canal normal.     Nose: Congestion present.     Mouth/Throat:     Mouth: Mucous membranes are moist.     Pharynx: Posterior oropharyngeal erythema present.     Tonsils: 2+ on the right. 2+ on the left.     Comments: No signs of peritonsillar abscess. Eyes:     Extraocular Movements: Extraocular movements intact.     Conjunctiva/sclera: Conjunctivae normal.     Pupils: Pupils are equal, round, and reactive to light.  Cardiovascular:     Rate and Rhythm: Normal rate and regular rhythm.     Pulses: Normal pulses.     Heart sounds: Normal heart sounds.  Pulmonary:     Effort: Pulmonary effort is normal. No respiratory  distress.  Breath sounds: Normal breath sounds. No wheezing.  Abdominal:     General: Abdomen is flat. Bowel sounds are normal.     Palpations: Abdomen is soft.  Musculoskeletal:        General: Normal range of motion.     Cervical back: Normal range of motion.  Skin:    General: Skin is warm and dry.  Neurological:     General: No focal deficit present.     Mental Status: She is alert and oriented to person, place, and time. Mental status is at baseline.  Psychiatric:        Mood and Affect: Mood normal.        Behavior: Behavior normal.     UC Treatments / Results  Labs (all labs ordered are listed, but only abnormal results are displayed) Labs Reviewed  CULTURE, GROUP A STREP ([facility])  NOVEL CORONAVIRUS, NAA  POCT RAPID STREP A (OFFICE)    EKG   Radiology No results found.  Procedures Procedures (including critical care time)  Medications Ordered in UC Medications - No data to display  Initial Impression / Assessment and Plan / UC Course  I have reviewed the triage vital signs and the nursing notes.  Pertinent labs & imaging results that were available during my care of the patient were reviewed by me and considered in my medical decision making (see chart for details).     Will treat acute tonsillitis and pharyngitis with amoxicillin x10 days.  Guaifenesin prescribed to take as needed for cough.  Rapid strep negative.  Throat culture and COVID-19 viral swab pending.  Discussed over-the-counter medications to help alleviate symptoms with patient.  Patient is nontoxic-appearing and does not appear to be in need of immediate medical attention at the hospital at this time.  No signs of possible peritonsillar abscess on exam. Discussed strict return precautions. Patient verbalized understanding and is agreeable with plan.  Final Clinical Impressions(s) / UC Diagnoses   Final diagnoses:  Acute tonsillitis, unspecified etiology  Sore throat  Encounter for laboratory  testing for COVID-19 virus     Discharge Instructions      Your rapid strep test was negative.  Throat culture and COVID-19 viral swab are pending.  We will call if they are positive.  You are being treated for acute tonsillitis with amoxicillin antibiotic.  You have also been prescribed medication to take as needed.  Please follow-up with urgent care or primary care if symptoms persist.     ED Prescriptions     Medication Sig Dispense Auth. Provider   amoxicillin (AMOXIL) 875 MG tablet Take 1 tablet (875 mg total) by mouth 2 (two) times daily for 10 days. 20 tablet Henrene Dodge E, FNP   guaiFENesin 200 MG tablet Take 1 tablet (200 mg total) by mouth every 4 (four) hours as needed for cough or to loosen phlegm. 30 suppository Lance Muss, FNP      PDMP not reviewed this encounter.   Lance Muss, FNP 11/21/20 1146

## 2020-11-21 NOTE — ED Triage Notes (Signed)
Pt c/o coughing, headache, runny nose, body chills onset last night.   Denies nausea, vomiting, diarrhea, constipation.  States tried motrin, lemon tea, halls cough drops at home without relief.

## 2020-11-21 NOTE — Discharge Instructions (Signed)
Your rapid strep test was negative.  Throat culture and COVID-19 viral swab are pending.  We will call if they are positive.  You are being treated for acute tonsillitis with amoxicillin antibiotic.  You have also been prescribed medication to take as needed.  Please follow-up with urgent care or primary care if symptoms persist.

## 2020-11-22 LAB — NOVEL CORONAVIRUS, NAA: SARS-CoV-2, NAA: NOT DETECTED

## 2020-11-22 LAB — SARS-COV-2, NAA 2 DAY TAT

## 2020-11-24 LAB — CULTURE, GROUP A STREP (THRC)

## 2020-12-27 ENCOUNTER — Encounter (HOSPITAL_COMMUNITY): Payer: Self-pay

## 2020-12-27 ENCOUNTER — Ambulatory Visit (HOSPITAL_COMMUNITY)
Admission: EM | Admit: 2020-12-27 | Discharge: 2020-12-27 | Disposition: A | Discharge status: Home / Self Care | Payer: Commercial Managed Care - PPO | Attending: Internal Medicine | Admitting: Internal Medicine

## 2020-12-27 ENCOUNTER — Other Ambulatory Visit: Payer: Self-pay

## 2020-12-27 DIAGNOSIS — Z113 Encounter for screening for infections with a predominantly sexual mode of transmission: Secondary | ICD-10-CM | POA: Insufficient documentation

## 2020-12-27 DIAGNOSIS — N390 Urinary tract infection, site not specified: Secondary | ICD-10-CM | POA: Insufficient documentation

## 2020-12-27 DIAGNOSIS — B009 Herpesviral infection, unspecified: Secondary | ICD-10-CM | POA: Diagnosis present

## 2020-12-27 DIAGNOSIS — R8290 Unspecified abnormal findings in urine: Secondary | ICD-10-CM | POA: Diagnosis present

## 2020-12-27 DIAGNOSIS — L739 Follicular disorder, unspecified: Secondary | ICD-10-CM | POA: Diagnosis present

## 2020-12-27 DIAGNOSIS — N760 Acute vaginitis: Secondary | ICD-10-CM | POA: Insufficient documentation

## 2020-12-27 LAB — POCT URINALYSIS DIPSTICK, ED / UC
Bilirubin Urine: NEGATIVE
Glucose, UA: NEGATIVE mg/dL
Ketones, ur: NEGATIVE mg/dL
Nitrite: NEGATIVE
Protein, ur: NEGATIVE mg/dL
Specific Gravity, Urine: 1.02 (ref 1.005–1.030)
Urobilinogen, UA: 0.2 mg/dL (ref 0.0–1.0)
pH: 6 (ref 5.0–8.0)

## 2020-12-27 LAB — HIV ANTIBODY (ROUTINE TESTING W REFLEX): HIV Screen 4th Generation wRfx: NONREACTIVE

## 2020-12-27 MED ORDER — VALACYCLOVIR HCL 1 G PO TABS: 1000.0000 mg | ORAL_TABLET | Freq: Every day | ORAL | 3 refills | Status: AC

## 2020-12-27 MED ORDER — CIPROFLOXACIN HCL 250 MG PO TABS: 250.0000 mg | ORAL_TABLET | Freq: Two times a day (BID) | ORAL | 0 refills | Status: DC

## 2020-12-27 NOTE — Discharge Instructions (Addendum)
Your urinalysis is concerning for acute urinary tract infection.  For this I prescribed a 5-day course of ciprofloxacin, please take 1 tablet twice daily.  Your urine sample has been sent to the lab for culture to identify the bacterial organism causing the infection, if the bacteria identified is not susceptible to ciprofloxacin, you will be contacted and provided with a more specific antibiotic.  To reduce the frequency of your HSV outbreaks, please begin Valacyclovir 1000 mg daily.  If you do have an outbreak while taking this daily regimen of suppression, which I do not believe you will if you are consistent, please increase your dose to twice daily for 5 days when symptoms appear then go back to taking once daily, do not increase to twice daily for more than 5 days.  For the little lump on the outside of your vaginal area, you can apply warm compresses as needed to help bring it out.  Please also know that ciprofloxacin should also provide coverage for any bacteria that may be causing this mild hair follicle infection but that for the most part, these little infections typically resolve themselves on their own.

## 2020-12-27 NOTE — ED Provider Notes (Signed)
[facility]    CSN: [id number] Arrival date & time: 12/27/20  1757      History   Chief Complaint No chief complaint on file.   HPI Kayla Ochoa is a 25 y.o. female.   Pt presents with vaginal irritation. States she has been taking her medication and states she noticed a lump. States she has a odor in her urine which has been present for approximately 4 days, patient states she has not had any burning with urination however has noticed that she has to go frequently and does not ever feel like her bladder completely empties..  Patient complains of lump on the right side of her labia majora in her pubic hair, states she shaves her pubic hair occasionally, states the lump is tender to palpation and a little itchy.  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    Patient reports having outbreak of genital herpes at least once a month, states she does not have any active herpes lesions at this time.  Patient adds that on her prescription for valacyclovir, she is advised to take 1 tablet daily and then to take 1 tablet 3 times daily when needed, patient asked me what this means.  Patient adds that when she has an active herpes lesion, she takes valacyclovir 500 mg twice daily because she feels the third dose is just too much.  Patient requests STD screening today.  Patient denies vaginal discharge at this time.  The history is provided by the patient.   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 Authorizing Provider  ciprofloxacin (CIPRO) 250 MG tablet Take 1 tablet (250 mg total) by mouth every 12 (twelve) hours. 12/27/20  Yes [name], PA-C  valACYclovir (VALTREX) 1000 MG tablet Take 1 tablet (1,000 mg total) by mouth daily. 12/27/20 03/27/21 Yes [name], PA-C  guaiFENesin 200 MG tablet Take 1 tablet (200 mg total) by mouth every 4 (four) hours as needed for cough or to loosen phlegm. 11/21/20  Teodora Medici, FNP    Family History Family History  Problem Relation Age of Onset   Breast cancer Maternal Aunt    Diabetes Paternal Aunt    Schizophrenia Maternal Grandmother    Diabetes Maternal Grandfather     Social History Social History   Tobacco Use   Smoking status: Former    Types: Cigars   Smokeless tobacco: Never  Vaping Use   Vaping Use: Never used  Substance Use Topics   Alcohol use: No     Allergies   Iodine, Fish allergy, Other, and Shrimp [shellfish allergy]   Review of Systems Review of Systems Pertinent findings noted in history of present illness.    Physical Exam Triage Vital Signs ED Triage Vitals  Enc Vitals Group     BP 12/21/20 0827 (!) 147/82     Pulse Rate 12/21/20 0827 72     Resp 12/21/20 0827 18     Temp 12/21/20 0827 98.3 F (36.8 C)     Temp Source  12/21/20 0827 Oral     SpO2 12/21/20 0827 98 %     Weight --      Height --      Head Circumference --      Peak Flow --      Pain Score 12/21/20 0826 5     Pain Loc --      Pain Edu? --      Excl. in [place]? --    No data found.  Updated Vital Signs BP (!) 135/93   Pulse 87   Temp 99.1 F (37.3 C) (Oral)   Resp 19   LMP  (LMP Unknown)   SpO2 100%   Visual Acuity Right Eye Distance:   Left Eye Distance:   Bilateral Distance:    Right Eye Near:   Left Eye Near:    Bilateral Near:     Physical Exam Vitals and nursing note reviewed.  Constitutional:      General: She is not in acute distress.    Appearance: Normal appearance. She is not ill-appearing.  HENT:     Head: Normocephalic and atraumatic.  Eyes:     General: Lids are normal.        Right eye: No discharge.        Left eye: No discharge.     Extraocular Movements: Extraocular movements intact.     Conjunctiva/sclera: Conjunctivae normal.     Right eye: Right conjunctiva is not injected.     Left eye: Left conjunctiva is not injected.  Neck:     Trachea: Trachea and phonation normal.  Cardiovascular:     Rate and Rhythm: Normal rate and regular rhythm.     Pulses: Normal pulses.     Heart sounds: Normal heart sounds. No murmur heard.   No friction rub. No gallop.  Pulmonary:     Effort: Pulmonary effort is normal. No accessory muscle usage, prolonged expiration or respiratory distress.     Breath sounds: Normal breath sounds. No stridor, decreased air movement or transmitted upper airway sounds. No decreased breath sounds, wheezing, rhonchi or rales.  Chest:     Chest wall: No tenderness.  Abdominal:     General: Abdomen is flat. Bowel sounds are normal. There is no distension.     Palpations: Abdomen is soft.     Tenderness: There is no abdominal tenderness. There is no right CVA tenderness or left CVA tenderness.     Hernia: No hernia is present.  Genitourinary:    Comments: Pt politely declines GU  exam, pt did provide a swab for testing.   Musculoskeletal:        General: Normal range of motion.     Cervical back: Normal range of motion and neck supple. Normal range of motion.  Lymphadenopathy:     Cervical: No cervical adenopathy.  Skin:    General: Skin is warm and dry.     Findings: No erythema or rash.  Neurological:     General: No focal deficit present.     Mental Status: She is alert and oriented to person, place, and time.  Psychiatric:        Mood and Affect: Mood normal.        Behavior: Behavior normal.     UC Treatments / Results  Labs (all labs ordered are listed, but only abnormal results are displayed) Labs Reviewed  POCT URINALYSIS DIPSTICK, ED / UC - Abnormal; Notable for the following components:      Result Value   Hgb urine dipstick TRACE (*)    Leukocytes,Ua TRACE (*)    All other components within normal limits  URINE CULTURE  HIV ANTIBODY (ROUTINE TESTING W REFLEX)  RPR  CERVICOVAGINAL ANCILLARY ONLY    EKG   Radiology No results found.  Procedures Procedures (including critical care time)  Medications Ordered in UC Medications - No data to display  Initial Impression / Assessment and Plan / UC Course  I have reviewed the triage vital signs and the nursing notes.  Pertinent labs & imaging results that were available during my care of the patient were reviewed by me and considered in my medical decision making (see chart for details).     Patient was provided with a 5-day course of ciprofloxacin for presumed acute lower urinary tract infection.  I have increased her dose of valacyclovir for better suppression given her frequent outbreaks.  STD screening was performed as requested, patient advised that she will receive treatment should it be required based on results.  Patient also advised that small area of folliculitis is self resolving but she is going to apply warm compress as needed.  Urine precautions advised.  Patient  verbalized understanding and agreement of plan as discussed.  All questions were addressed during visit.  Please see discharge instructions below for further details of plan.  Final Clinical Impressions(s) / UC Diagnoses   Final diagnoses:  Acute vaginitis  Urine malodor  Screening examination for STD (sexually transmitted disease)  Folliculitis  HSV-2 (herpes simplex virus 2) infection  Acute lower UTI (urinary tract infection)     Discharge Instructions      Your urinalysis is concerning for acute urinary tract infection.  For this I prescribed a 5-day course of ciprofloxacin, please take 1 tablet twice daily.  Your urine sample has been sent to the lab for culture to identify the bacterial organism causing the infection, if the bacteria identified is not susceptible to ciprofloxacin, you will be contacted and provided with a more specific antibiotic.  To reduce the frequency of your HSV outbreaks, please begin Valacyclovir 1000 mg daily.  If you do have an outbreak while taking this daily regimen of suppression, which I do not believe you will if you are consistent, please increase your dose to twice daily for 5 days when symptoms appear then go back to taking once daily, do not increase to twice daily for more than 5 days.  For the little lump on the outside  of your vaginal area, you can apply warm compresses as needed to help bring it out.  Please also know that ciprofloxacin should also provide coverage for any bacteria that may be causing this mild hair follicle infection but that for the most part, these little infections typically resolve themselves on their own.     ED Prescriptions     Medication Sig Dispense Auth. Provider   ciprofloxacin (CIPRO) 250 MG tablet Take 1 tablet (250 mg total) by mouth every 12 (twelve) hours. 10 tablet [name], PA-C   valACYclovir (VALTREX) 1000 MG tablet Take 1 tablet (1,000 mg total) by mouth daily. 90 tablet Theadora Rama  Scales, [place]      PDMP not reviewed this encounter.    [name], PA-C 12/27/20 1941

## 2020-12-27 NOTE — ED Triage Notes (Signed)
Pt presents with vaginal irritation. States she has been taking her medication and states she noticed a lump. States she has a odor in her urine.

## 2020-12-28 LAB — CERVICOVAGINAL ANCILLARY ONLY
Bacterial Vaginitis (gardnerella): NEGATIVE
Candida Glabrata: NEGATIVE
Candida Vaginitis: NEGATIVE
Chlamydia: NEGATIVE
Comment: NEGATIVE
Comment: NEGATIVE
Comment: NEGATIVE
Comment: NEGATIVE
Comment: NEGATIVE
Comment: NORMAL
Neisseria Gonorrhea: NEGATIVE
Trichomonas: NEGATIVE

## 2020-12-28 LAB — RPR: RPR Ser Ql: NONREACTIVE

## 2020-12-29 ENCOUNTER — Encounter (HOSPITAL_COMMUNITY): Payer: Self-pay

## 2020-12-29 ENCOUNTER — Other Ambulatory Visit: Payer: Self-pay

## 2020-12-29 ENCOUNTER — Emergency Department (HOSPITAL_COMMUNITY)
Admission: EM | Admit: 2020-12-29 | Discharge: 2020-12-30 | Disposition: A | Discharge status: Home / Self Care | Payer: Commercial Managed Care - PPO | Attending: Emergency Medicine | Admitting: Emergency Medicine

## 2020-12-29 DIAGNOSIS — R000 Tachycardia, unspecified: Secondary | ICD-10-CM | POA: Diagnosis present

## 2020-12-29 DIAGNOSIS — J45909 Unspecified asthma, uncomplicated: Secondary | ICD-10-CM | POA: Diagnosis not present

## 2020-12-29 DIAGNOSIS — T50901A Poisoning by unspecified drugs, medicaments and biological substances, accidental (unintentional), initial encounter: Secondary | ICD-10-CM | POA: Insufficient documentation

## 2020-12-29 DIAGNOSIS — Z87891 Personal history of nicotine dependence: Secondary | ICD-10-CM | POA: Diagnosis not present

## 2020-12-29 DIAGNOSIS — T6594XA Toxic effect of unspecified substance, undetermined, initial encounter: Secondary | ICD-10-CM

## 2020-12-29 LAB — URINE CULTURE: Culture: >=100000 — AB

## 2020-12-29 MED ORDER — LORAZEPAM 1 MG PO TABS
1.0000 mg | ORAL_TABLET | Freq: Once | ORAL | Status: AC
  Administered 2020-12-29: 1 mg via ORAL
  Filled 2020-12-29: qty 1

## 2020-12-29 MED ORDER — SODIUM CHLORIDE 0.9 % IV BOLUS
1000.0000 mL | Freq: Once | INTRAVENOUS | Status: AC
  Administered 2020-12-29: 1000 mL via INTRAVENOUS

## 2020-12-29 NOTE — ED Triage Notes (Signed)
Pt from home with ems, smoked delta 8 about 2 hrs ago and 30 mins later she started to feel very jittery and weak. HR initially 178, vagal maneuver decreased her HR to 120s. Pt also given fluid en route. Pt arrives to ED a/o

## 2020-12-29 NOTE — ED Provider Notes (Signed)
Emergency Medicine Provider Triage Evaluation Note  Kayla Ochoa , a 25 y.o. female  was evaluated in triage.  Pt complains of anxiety.  States she used CBD (delta 8) and it caused her heart to race and her to feel anxious.  Reports acid reflux as well.  Has hx of anxiety.  Review of Systems  Positive: Racing heart, anxiety Negative: Fever, chills  Physical Exam  BP (!) 147/83   Pulse (!) 124   Temp 98.9 F (37.2 C) (Oral)   Resp 18   Ht 5\' 1"  (1.549 m)   Wt 67.1 kg   LMP  (LMP Unknown)   SpO2 100%   BMI 27.96 kg/m  Gen:   Awake, no distress   Resp:  Normal effort  MSK:   Moves extremities without difficulty  Other:  Tachycardic  Medical Decision Making  Medically screening exam initiated at 10:04 PM.  Appropriate orders placed.  Savonna Birchmeier was informed that the remainder of the evaluation will be completed by another provider, this initial triage assessment does not replace that evaluation, and the importance of remaining in the ED until their evaluation is complete.  Tachycardia, drug reaction   Claudean Kinds 12/29/20 2206    2207, MD 12/30/20 2237

## 2020-12-30 DIAGNOSIS — R000 Tachycardia, unspecified: Secondary | ICD-10-CM | POA: Diagnosis not present

## 2020-12-30 NOTE — ED Provider Notes (Signed)
[facility] EMERGENCY DEPARTMENT Provider Note   CSN: [id number] Arrival date & time: 12/29/20  2149     History Chief Complaint  Patient presents with   Tachycardia   Ingestion    Kayla Ochoa is a 25 y.o. female.  Patient here with chief complaint of palpitations and racing heart.  She states that she feels very anxious.  Reports having used delta 8 earlier tonight.  She states that she is got a history of anxiety, and sees a therapist for this.  She states that her symptoms have worsened with regard to her anxiety and this is why she took the CBD, but does not anticipate the adverse effect of the delta 8 type.  The history is provided by the patient. No language interpreter was used.      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    Past Surgical History:  Procedure Laterality Date   TYMPANOSTOMY TUBE PLACEMENT Bilateral 2001   WISDOM TOOTH EXTRACTION Bilateral      OB History     Gravida  0   Para  0   Term  0   Preterm  0   AB  0   Living  0      SAB  0   IAB  0   Ectopic  0   Multiple  0   Live Births  0           Family History  Problem Relation Age of Onset   Breast cancer Maternal Aunt    Diabetes Paternal Aunt    Schizophrenia Maternal Grandmother    Diabetes Maternal Grandfather     Social History   Tobacco Use   Smoking status: Former    Types: Cigars   Smokeless tobacco: Never  Vaping Use   Vaping Use: Never used   Substance Use Topics   Alcohol use: No    Home Medications Prior to Admission medications   Medication Sig Start Date End Date Taking? Authorizing Provider  ciprofloxacin (CIPRO) 250 MG tablet Take 1 tablet (250 mg total) by mouth every 12 (twelve) hours. 12/27/20   [name], PA-C  guaiFENesin 200 MG tablet Take 1 tablet (200 mg total) by mouth every 4 (four) hours as needed for cough or to loosen phlegm. 11/21/20   Gustavus Bryant, FNP  valACYclovir (VALTREX) 1000 MG tablet Take 1 tablet (1,000 mg total) by mouth daily. 12/27/20 03/27/21  [name], PA-C    Allergies    Iodine, Fish allergy, Other, and Shrimp [shellfish allergy]  Review of Systems   Review of Systems  All other systems reviewed and are negative.  Physical Exam Updated Vital Signs BP 114/65 (BP Location: Right Arm)   Pulse (!) 113   Temp 97.9 F (36.6 C)   Resp 18   Ht 5\' 1"  (1.549 m)   Wt 67.1 kg   LMP  (LMP Unknown)   SpO2 99%   BMI 27.96 kg/m  Physical Exam Vitals and nursing note reviewed.  Constitutional:      General: She is not in acute distress.    Appearance: She is well-developed.  HENT:     Head: Normocephalic and atraumatic.  Eyes:     Conjunctiva/sclera: Conjunctivae normal.  Cardiovascular:     Rate and Rhythm: Tachycardia present.     Heart sounds: No murmur heard. Pulmonary:     Effort: Pulmonary effort is normal. No respiratory distress.  Abdominal:     General: There is no distension.  Musculoskeletal:     Cervical back: Neck supple.     Comments: Moves all extremities  Skin:    General: Skin is warm and dry.  Neurological:     Mental Status: She is alert and oriented to person, place, and time.  Psychiatric:        Mood and Affect: Mood normal.        Behavior: Behavior normal.    ED Results / Procedures / Treatments   Labs (all labs ordered are listed, but only abnormal results are displayed) Labs Reviewed - No data to  display  EKG None  Radiology No results found.  Procedures Procedures   Medications Ordered in ED Medications  LORazepam (ATIVAN) tablet 1 mg (1 mg Oral Given 12/29/20 2210)  sodium chloride 0.9 % bolus 1,000 mL (1,000 mLs Intravenous New Bag/Given 12/29/20 2211)    ED Course  I have reviewed the triage vital signs and the nursing notes.  Pertinent labs & imaging results that were available during my care of the patient were reviewed by me and considered in my medical decision making (see chart for details).    MDM Rules/Calculators/A&P                           Patient here with adverse reaction to delta 8.  She was given Ativan in triage, and now states that she feels significantly improved.  Her heart rate has decreased significantly.  She appears stable for discharge.  She will follow-up with her doctor regarding her anxiety. Final Clinical Impression(s) / ED Diagnoses Final diagnoses:  Tachycardia  Ingestion of substance, undetermined intent, initial encounter    Rx / DC Orders ED Discharge Orders     None        Roxy Horseman, PA-C 12/30/20 0127    Melene Plan, DO 12/30/20 [phone]

## 2020-12-30 NOTE — Discharge Instructions (Signed)
I recommend not ever using delta 8 again.  Please discuss with your primary care doctor your anxiety.

## 2021-01-03 ENCOUNTER — Encounter (HOSPITAL_COMMUNITY): Payer: Self-pay

## 2021-01-03 ENCOUNTER — Other Ambulatory Visit: Payer: Self-pay

## 2021-01-03 ENCOUNTER — Emergency Department (HOSPITAL_COMMUNITY)
Admission: EM | Admit: 2021-01-03 | Discharge: 2021-01-03 | Disposition: A | Discharge status: Home / Self Care | Payer: Commercial Managed Care - PPO | Attending: Emergency Medicine | Admitting: Emergency Medicine

## 2021-01-03 DIAGNOSIS — Z87891 Personal history of nicotine dependence: Secondary | ICD-10-CM | POA: Diagnosis not present

## 2021-01-03 DIAGNOSIS — Z79899 Other long term (current) drug therapy: Secondary | ICD-10-CM | POA: Diagnosis not present

## 2021-01-03 DIAGNOSIS — R002 Palpitations: Secondary | ICD-10-CM

## 2021-01-03 DIAGNOSIS — F419 Anxiety disorder, unspecified: Secondary | ICD-10-CM | POA: Insufficient documentation

## 2021-01-03 DIAGNOSIS — J45909 Unspecified asthma, uncomplicated: Secondary | ICD-10-CM | POA: Diagnosis not present

## 2021-01-03 LAB — CBC WITH DIFFERENTIAL/PLATELET
Abs Immature Granulocytes: 0.02 10*3/uL (ref 0.00–0.07)
Basophils Absolute: 0 10*3/uL (ref 0.0–0.1)
Basophils Relative: 0 %
Eosinophils Absolute: 0.2 10*3/uL (ref 0.0–0.5)
Eosinophils Relative: 2 %
HCT: 38.8 % (ref 36.0–46.0)
Hemoglobin: 12.6 g/dL (ref 12.0–15.0)
Immature Granulocytes: 0 %
Lymphocytes Relative: 17 %
Lymphs Abs: 1.3 10*3/uL (ref 0.7–4.0)
MCH: 27.8 pg (ref 26.0–34.0)
MCHC: 32.5 g/dL (ref 30.0–36.0)
MCV: 85.5 fL (ref 80.0–100.0)
Monocytes Absolute: 0.4 10*3/uL (ref 0.1–1.0)
Monocytes Relative: 5 %
Neutro Abs: 5.8 10*3/uL (ref 1.7–7.7)
Neutrophils Relative %: 76 %
Platelets: 296 10*3/uL (ref 150–400)
RBC: 4.54 MIL/uL (ref 3.87–5.11)
RDW: 13.5 % (ref 11.5–15.5)
WBC: 7.7 10*3/uL (ref 4.0–10.5)
nRBC: 0 % (ref 0.0–0.2)

## 2021-01-03 LAB — RAPID URINE DRUG SCREEN, HOSP PERFORMED
Amphetamines: NOT DETECTED
Barbiturates: NOT DETECTED
Benzodiazepines: NOT DETECTED
Cocaine: NOT DETECTED
Opiates: NOT DETECTED
Tetrahydrocannabinol: POSITIVE — AB

## 2021-01-03 LAB — BASIC METABOLIC PANEL
Anion gap: 9 (ref 5–15)
BUN: 8 mg/dL (ref 6–20)
CO2: 24 mmol/L (ref 22–32)
Calcium: 10 mg/dL (ref 8.9–10.3)
Chloride: 106 mmol/L (ref 98–111)
Creatinine, Ser: 0.69 mg/dL (ref 0.44–1.00)
GFR, Estimated: >60 mL/min (ref >60)
Glucose, Bld: 97 mg/dL (ref 70–99)
Potassium: 3.7 mmol/L (ref 3.5–5.1)
Sodium: 139 mmol/L (ref 135–145)

## 2021-01-03 LAB — PREGNANCY, URINE: Preg Test, Ur: NEGATIVE

## 2021-01-03 NOTE — Discharge Instructions (Signed)
Please talk to your primary care provider about starting something for your anxiety.  Please return to the emergency department if you develop worsening symptoms again.

## 2021-01-03 NOTE — ED Provider Notes (Signed)
[facility] DEPT Provider Note   CSN: [id number] Arrival date & time: 01/03/21  1932     History Chief Complaint  Patient presents with   Anxiety   Tachycardia    Kayla Ochoa is a 25 y.o. female.  Past medical history of MDD, anxiety, cannabis use disorder.  She presents to the emergency room today with complaints of anxiety and racing heart rate after she took a delta 8 gummy at approximately 1830.  This is happened to her in the past where she will have anxiety attacks after taking delta 8 and she has been seen in the emergency department about 4 days ago for the same thing.  On my assessment, patient states that she is no longer feeling anxious or that her heart is racing without any intervention.  She denies any chest pain, shortness of breath, abdominal pain, nausea, vomiting.   Anxiety Pertinent negatives include no chest pain, no abdominal pain, no headaches and no shortness of breath.      Past Medical History:  Diagnosis Date   Anxiety    Asthma    Depression    HSV infection    Juvenile rheumatoid arthritis ([place])    Uveitis     Patient Active Problem List   Diagnosis Date Noted   Traumatic avulsion of nail plate of toe X33443   Cannabis use disorder, mild, abuse 02/22/2020   Major depressive disorder, recurrent episode, severe, with psychosis ([place]) 01/08/2017   Major depressive disorder, recurrent, severe with psychotic features ([place]) 01/08/2017   Juvenile rheumatoid arthritis ([place]) 11/17/2011    Past Surgical History:  Procedure Laterality Date   TYMPANOSTOMY TUBE PLACEMENT Bilateral 2001   WISDOM TOOTH EXTRACTION Bilateral      OB History     Gravida  0   Para  0   Term  0   Preterm  0   AB  0   Living  0      SAB  0   IAB  0   Ectopic  0   Multiple  0   Live Births  0           Family History  Problem Relation Age of Onset   Breast cancer Maternal Aunt    Diabetes Paternal Aunt     Schizophrenia Maternal Grandmother    Diabetes Maternal Grandfather     Social History   Tobacco Use   Smoking status: Former    Types: Cigars   Smokeless tobacco: Never  Vaping Use   Vaping Use: Never used  Substance Use Topics   Alcohol use: No    Home Medications Prior to Admission medications   Medication Sig Start Date End Date Taking? Authorizing Provider  ciprofloxacin (CIPRO) 250 MG tablet Take 1 tablet (250 mg total) by mouth every 12 (twelve) hours. 12/27/20   Lynden Oxford Scales, PA-C  guaiFENesin 200 MG tablet Take 1 tablet (200 mg total) by mouth every 4 (four) hours as needed for cough or to loosen phlegm. 11/21/20   Teodora Medici, FNP  valACYclovir (VALTREX) 1000 MG tablet Take 1 tablet (1,000 mg total) by mouth daily. 12/27/20 03/27/21  Lynden Oxford Scales, PA-C    Allergies    Iodine, Fish allergy, Other, and Shrimp [shellfish allergy]  Review of Systems   Review of Systems  Constitutional:  Negative for chills and fever.  HENT:  Negative for congestion, rhinorrhea and sore throat.   Eyes:  Negative for visual disturbance.  Respiratory:  Negative for cough, chest tightness and shortness of breath.   Cardiovascular:  Positive for palpitations. Negative for chest pain and leg swelling.  Gastrointestinal:  Negative for abdominal pain, blood in stool, constipation, diarrhea, nausea and vomiting.  Genitourinary:  Negative for dysuria, flank pain and hematuria.  Musculoskeletal:  Negative for back pain.  Skin:  Negative for rash and wound.  Neurological:  Negative for dizziness, syncope, weakness, light-headedness and headaches.  Psychiatric/Behavioral:  Negative for confusion. The patient is nervous/anxious.   All other systems reviewed and are negative.  Physical Exam Updated Vital Signs BP 117/71   Pulse 95   Temp 99.3 F (37.4 C) (Oral)   Resp 20   LMP  (LMP Unknown)   SpO2 98%   Physical Exam Vitals and nursing note reviewed.  Constitutional:       General: She is not in acute distress.    Appearance: Normal appearance. She is not ill-appearing, toxic-appearing or diaphoretic.  HENT:     Head: Normocephalic and atraumatic.     Nose: No nasal deformity.     Mouth/Throat:     Lips: Pink. No lesions.     Mouth: Mucous membranes are moist. No injury, lacerations, oral lesions or angioedema.     Pharynx: Oropharynx is clear. Uvula midline. No pharyngeal swelling, oropharyngeal exudate, posterior oropharyngeal erythema or uvula swelling.  Eyes:     General: Gaze aligned appropriately. No scleral icterus.       Right eye: No discharge.        Left eye: No discharge.     Conjunctiva/sclera: Conjunctivae normal.     Right eye: Right conjunctiva is not injected. No exudate or hemorrhage.    Left eye: Left conjunctiva is not injected. No exudate or hemorrhage.    Pupils: Pupils are equal, round, and reactive to light.  Cardiovascular:     Rate and Rhythm: Regular rhythm. Tachycardia present.     Pulses: Normal pulses.          Radial pulses are 2+ on the right side and 2+ on the left side.       Dorsalis pedis pulses are 2+ on the right side and 2+ on the left side.     Heart sounds: Normal heart sounds, S1 normal and S2 normal. Heart sounds not distant. No murmur heard.   No friction rub. No gallop. No S3 or S4 sounds.  Pulmonary:     Effort: Pulmonary effort is normal. No accessory muscle usage or respiratory distress.     Breath sounds: Normal breath sounds. No stridor. No wheezing, rhonchi or rales.  Chest:     Chest wall: No tenderness.  Abdominal:     General: Abdomen is flat. Bowel sounds are normal. There is no distension.     Palpations: Abdomen is soft. There is no mass or pulsatile mass.     Tenderness: There is no abdominal tenderness. There is no guarding or rebound.  Musculoskeletal:     Right lower leg: No edema.     Left lower leg: No edema.  Skin:    General: Skin is warm and dry.     Coloration: Skin is not  jaundiced or pale.     Findings: No bruising, erythema, lesion or rash.  Neurological:     General: No focal deficit present.     Mental Status: She is alert and oriented to person, place, and time.     GCS: GCS eye subscore is 4. GCS verbal subscore is 5.  GCS motor subscore is 6.  Psychiatric:        Mood and Affect: Mood normal.        Behavior: Behavior normal. Behavior is cooperative.    ED Results / Procedures / Treatments   Labs (all labs ordered are listed, but only abnormal results are displayed) Labs Reviewed  RAPID URINE DRUG SCREEN, HOSP PERFORMED - Abnormal; Notable for the following components:      Result Value   Tetrahydrocannabinol POSITIVE (*)    All other components within normal limits  CBC WITH DIFFERENTIAL/PLATELET  BASIC METABOLIC PANEL  PREGNANCY, URINE    EKG None  Radiology No results found.  Procedures Procedures   Medications Ordered in ED Medications - No data to display  ED Course  I have reviewed the triage vital signs and the nursing notes.  Pertinent labs & imaging results that were available during my care of the patient were reviewed by me and considered in my medical decision making (see chart for details).    MDM Rules/Calculators/A&P                         Patient presents after taking delta 8 and having anxiety and palpitations following ingestion.  On arrival she is tachycardic to 135.  Per triage note she was visibly anxious and was experiencing flushing.  Labs reviewed and CBC normal.  BMP normal.  Pregnancy negative.  Positive for THC.  On reassessment, with time to allow for metabolization, patient in no acute distress.  Heart rate has improved to 95.  No intervention was done at this time.  Suspect that presentation is due to use of delta 8 and associated anxiety.  Recommend cessation from delta 8 since the second time that this has occurred in the past week.  Recommend following up with PCP and inquiring about long term  anxiety treatment.  Patient has an appointment on Monday.  Return precautions provided.   Final Clinical Impression(s) / ED Diagnoses Final diagnoses:  Anxiety  Palpitations    Rx / DC Orders ED Discharge Orders     None        Adolphus Birchwood, PA-C 01/03/21 2303    Truddie Hidden, MD 01/03/21 2318

## 2021-01-03 NOTE — ED Triage Notes (Signed)
Pt BIB EMS from home. Pt took a delta 8 gummy an hr ago. Pt reports with anxiety and tachycardia.

## 2021-01-03 NOTE — ED Provider Notes (Signed)
Emergency Medicine Provider Triage Evaluation Note  Kayla Ochoa , a 25 y.o. female  was evaluated in triage.  Pt complains of anxiety that started after smoking delta 8 tonight  Review of Systems  Positive: Anxiety, palpitations, flushing Negative: fevers  Physical Exam  BP 134/83 (BP Location: Right Arm)   Pulse (!) 135   Temp 99.3 F (37.4 C) (Oral)   Resp (!) 22   LMP  (LMP Unknown)   SpO2 100%  Gen:   Awake, no distress   Resp:  Normal effort  MSK:   Moves extremities without difficulty  Other:  tachycardia  Medical Decision Making  Medically screening exam initiated at 7:52 PM.  Appropriate orders placed.  Kayla Ochoa was informed that the remainder of the evaluation will be completed by another provider, this initial triage assessment does not replace that evaluation, and the importance of remaining in the ED until their evaluation is complete.     Kayla Ochoa, Kayla Kynard S, PA-C 01/03/21 2001    Kayla Leiter, DO 01/04/21 [phone]

## 2021-01-04 ENCOUNTER — Ambulatory Visit (INDEPENDENT_AMBULATORY_CARE_PROVIDER_SITE_OTHER): Payer: Commercial Managed Care - PPO

## 2021-01-04 VITALS — BP 124/85 | HR 97 | Ht 61.0 in | Wt 149.0 lb

## 2021-01-04 DIAGNOSIS — Z3042 Encounter for surveillance of injectable contraceptive: Secondary | ICD-10-CM

## 2021-01-04 MED ORDER — MEDROXYPROGESTERONE ACETATE 150 MG/ML IM SUSP
150.0000 mg | Freq: Once | INTRAMUSCULAR | Status: AC
Start: 2021-01-04 — End: 2021-01-04
  Administered 2021-01-04: 150 mg via INTRAMUSCULAR

## 2021-01-04 NOTE — Progress Notes (Signed)
Depo Provera 150mg  given IM Left Deltoid. Pt tolerated well with no adverse side effects noted. Pt to return to office for repeat injection between 03/22/21-04/05/21. Appointment made at checkout.

## 2021-02-16 ENCOUNTER — Encounter (HOSPITAL_COMMUNITY): Payer: Self-pay

## 2021-02-16 ENCOUNTER — Emergency Department (HOSPITAL_COMMUNITY)
Admission: EM | Admit: 2021-02-16 | Discharge: 2021-02-17 | Disposition: A | Discharge status: Home / Self Care | Payer: Commercial Managed Care - PPO | Attending: Emergency Medicine | Admitting: Emergency Medicine

## 2021-02-16 DIAGNOSIS — Z5321 Procedure and treatment not carried out due to patient leaving prior to being seen by health care provider: Secondary | ICD-10-CM | POA: Diagnosis not present

## 2021-02-16 DIAGNOSIS — F419 Anxiety disorder, unspecified: Secondary | ICD-10-CM | POA: Insufficient documentation

## 2021-02-16 NOTE — ED Triage Notes (Signed)
Pt BIB [facility] from home after experiencing an anxiety/panic attack. Pt states she has hx of GERD and had a GERD flair up, this caused pt to have a panic attack. Pt states she was on buspirone to manage anxiety and took herself off of it 2 days ago. Pt is A&Ox4. Pt anxiety is now low.

## 2021-02-16 NOTE — ED Notes (Signed)
Pt requested BP and HR be rechecked. 135/74 and HR 125. Pt states she could feel her heart "beating fast again." Pt felt better knowing BP and HR.

## 2021-02-17 NOTE — ED Notes (Signed)
Pt not present in lobby

## 2021-02-17 NOTE — ED Notes (Signed)
Pt not present for VS update.

## 2021-03-04 ENCOUNTER — Encounter (HOSPITAL_COMMUNITY): Payer: Self-pay | Admitting: Emergency Medicine

## 2021-03-04 ENCOUNTER — Other Ambulatory Visit: Payer: Self-pay

## 2021-03-04 ENCOUNTER — Ambulatory Visit (HOSPITAL_COMMUNITY)
Admission: EM | Admit: 2021-03-04 | Discharge: 2021-03-04 | Disposition: A | Discharge status: Home / Self Care | Payer: Commercial Managed Care - PPO | Attending: Family Medicine | Admitting: Family Medicine

## 2021-03-04 DIAGNOSIS — J4520 Mild intermittent asthma, uncomplicated: Secondary | ICD-10-CM

## 2021-03-04 DIAGNOSIS — F411 Generalized anxiety disorder: Secondary | ICD-10-CM

## 2021-03-04 MED ORDER — ALBUTEROL SULFATE HFA 108 (90 BASE) MCG/ACT IN AERS: 1.0000 | INHALATION_SPRAY | RESPIRATORY_TRACT | 0 refills | Status: DC | PRN

## 2021-03-04 NOTE — Discharge Instructions (Addendum)
Try cutting your buspirone 7.5 mg in half, and take 1/2 AM and 1/2 every afternoon. Continue hydroxyzine every evening.  The albuterol is 2 puffs as needed for asthma attack/shortness of breath/wheezing  Keep appointments for your doctor and the counseling

## 2021-03-04 NOTE — ED Provider Notes (Signed)
[facility]    CSN: [id number] Arrival date & time: 03/04/21  1710      History   Chief Complaint Chief Complaint  Patient presents with   Sore Throat    HPI Kayla Ochoa is a 26 y.o. female.    Sore Throat  Here for recent anxiety and possible side effects from medication.  In November she was prescribed buspirone 7.5 mg twice daily for her anxiety.  She was also prescribed hydroxyzine 25 mg 3 times daily.  Later in November she tried off her buspirone and proceeded to have a panic attack 2 days later.  Since then she has been taking her buspirone once daily in the morning and her hydroxyzine once in the evening since it makes her sleepy.  She does feel that they help her anxiety but in the afternoon she starts getting "brain zaps" and feeling anxious.  Yesterday she had an episode of shortness of breath.  And today she has had a sharp pain in her right neck which is actually let up a little bit since it began earlier this afternoon.  No fever or chills.  No nausea vomiting or diarrhea.  She does have a history of asthma and does not have an inhaler.  She will see her primary doctor next week.  She is also scheduled for intake with mental health counselor for next week  Past Medical History:  Diagnosis Date   Anxiety    Asthma    Depression    HSV infection    Juvenile rheumatoid arthritis ([place])    Uveitis     Patient Active Problem List   Diagnosis Date Noted   Traumatic avulsion of nail plate of toe X33443   Cannabis use disorder, mild, abuse 02/22/2020   Major depressive disorder, recurrent episode, severe, with psychosis ([place]) 01/08/2017   Major depressive disorder, recurrent, severe with psychotic features ([place]) 01/08/2017   Juvenile rheumatoid arthritis ([place]) 11/17/2011    Past Surgical History:  Procedure Laterality Date   TYMPANOSTOMY TUBE PLACEMENT Bilateral 2001   WISDOM TOOTH EXTRACTION Bilateral     OB History     Gravida  0   Para  0    Term  0   Preterm  0   AB  0   Living  0      SAB  0   IAB  0   Ectopic  0   Multiple  0   Live Births  0            Home Medications    Prior to Admission medications   Medication Sig Start Date End Date Taking? Authorizing Provider  albuterol (VENTOLIN HFA) 108 (90 Base) MCG/ACT inhaler Inhale 1-2 puffs into the lungs every 4 (four) hours as needed for wheezing or shortness of breath. 03/04/21  Yes Barrett Henle, MD  busPIRone (BUSPAR) 7.5 MG tablet Take 7.5 mg by mouth once.   Yes [provider]  hydrOXYzine (ATARAX) 25 MG tablet Take 25 mg by mouth at bedtime.   Yes [provider]  ciprofloxacin (CIPRO) 250 MG tablet Take 1 tablet (250 mg total) by mouth every 12 (twelve) hours. Patient not taking: Reported on 03/04/2021 12/27/20   Lynden Oxford Scales, PA-C  medroxyPROGESTERone (DEPO-PROVERA) 150 MG/ML injection Inject into the muscle. 01/04/21   [provider]  valACYclovir (VALTREX) 1000 MG tablet Take 1 tablet (1,000 mg total) by mouth daily. Patient not taking: Reported on 03/04/2021 12/27/20 03/27/21  Lynden Oxford  Scales, PA-C    Family History Family History  Problem Relation Age of Onset   Cancer Mother    Schizophrenia Maternal Grandmother    Diabetes Maternal Grandfather    Breast cancer Maternal Aunt    Diabetes Paternal Aunt     Social History Social History   Tobacco Use   Smoking status: Former    Types: Cigars   Smokeless tobacco: Never  Vaping Use   Vaping Use: Never used  Substance Use Topics   Alcohol use: No   Drug use: Not Currently    Types: Other-see comments    Comment: CBD     Allergies   Iodine, Fish allergy, Other, and Shrimp [shellfish allergy]   Review of Systems Review of Systems   Physical Exam Triage Vital Signs ED Triage Vitals  Enc Vitals Group     BP 03/04/21 1826 125/73     Pulse Rate 03/04/21 1826 100     Resp 03/04/21 1826 18     Temp 03/04/21 1826 99.3 F (37.4  C)     Temp Source 03/04/21 1826 Oral     SpO2 03/04/21 1826 97 %     Weight --      Height --      Head Circumference --      Peak Flow --      Pain Score 03/04/21 1823 2     Pain Loc --      Pain Edu? --      Excl. in [place]? --    No data found.  Updated Vital Signs BP 125/73 (BP Location: Right Arm)    Pulse 100    Temp 99.3 F (37.4 C) (Oral)    Resp 18    SpO2 97%   Visual Acuity Right Eye Distance:   Left Eye Distance:   Bilateral Distance:    Right Eye Near:   Left Eye Near:    Bilateral Near:     Physical Exam Vitals reviewed.  Constitutional:      General: She is not in acute distress.    Appearance: She is not toxic-appearing.  HENT:     Nose: Nose normal.     Mouth/Throat:     Pharynx: No oropharyngeal exudate or posterior oropharyngeal erythema.     Comments: Has 2+ tonsils, no exudate or erythema Eyes:     Extraocular Movements: Extraocular movements intact.     Conjunctiva/sclera: Conjunctivae normal.     Pupils: Pupils are equal, round, and reactive to light.  Cardiovascular:     Rate and Rhythm: Normal rate and regular rhythm.     Heart sounds: No murmur heard. Pulmonary:     Effort: Pulmonary effort is normal.     Breath sounds: No stridor. No wheezing, rhonchi or rales.  Musculoskeletal:     Cervical back: Neck supple. No tenderness.  Lymphadenopathy:     Cervical: No cervical adenopathy.  Skin:    Capillary Refill: Capillary refill takes less than 2 seconds.     Coloration: Skin is not jaundiced or pale.  Neurological:     General: No focal deficit present.     Mental Status: She is alert and oriented to person, place, and time.  Psychiatric:        Behavior: Behavior normal.     UC Treatments / Results  Labs (all labs ordered are listed, but only abnormal results are displayed) Labs Reviewed - No data to display  EKG   Radiology No results found.  Procedures Procedures (including critical care time)  Medications Ordered in  UC Medications - No data to display  Initial Impression / Assessment and Plan / UC Course  I have reviewed the triage vital signs and the nursing notes.  Pertinent labs & imaging results that were available during my care of the patient were reviewed by me and considered in my medical decision making (see chart for details).     Will have her cut her buspirone in half and take it AM and afternoon.  Albuterol sent for her to try, in case her dyspnea was an asthma attack. Final Clinical Impressions(s) / UC Diagnoses   Final diagnoses:  Generalized anxiety disorder  Mild intermittent asthma without complication     Discharge Instructions      Try cutting your buspirone 7.5 mg in half, and take 1/2 AM and 1/2 every afternoon. Continue hydroxyzine every evening.  The albuterol is 2 puffs as needed for asthma attack/shortness of breath/wheezing  Keep appointments for your doctor and the counseling     ED Prescriptions     Medication Sig Dispense Auth. Provider   albuterol (VENTOLIN HFA) 108 (90 Base) MCG/ACT inhaler Inhale 1-2 puffs into the lungs every 4 (four) hours as needed for wheezing or shortness of breath. 1 each Barrett Henle, MD      PDMP not reviewed this encounter.   Barrett Henle, MD 03/04/21 [phone]

## 2021-03-04 NOTE — ED Triage Notes (Signed)
Patient had a panic attack in December.  Patient had stopped medicines prior to this attack..  Since then has had buspirone once a day and hydroxyzine at night different scheduling than written for)   Patient has an appt with pcp for next week.    Yesterday had sob in the evening.   Today felt a stabbing sensation in right side of neck.  Patient wants to make sure this is related to medicines for anxiety

## 2021-03-07 ENCOUNTER — Ambulatory Visit (HOSPITAL_COMMUNITY): Payer: Medicaid Other | Admitting: Clinical

## 2021-03-14 ENCOUNTER — Ambulatory Visit (HOSPITAL_COMMUNITY): Payer: Medicaid Other | Admitting: Clinical

## 2021-03-25 ENCOUNTER — Ambulatory Visit: Payer: Commercial Managed Care - PPO

## 2021-03-29 ENCOUNTER — Ambulatory Visit (INDEPENDENT_AMBULATORY_CARE_PROVIDER_SITE_OTHER): Payer: Commercial Managed Care - PPO | Admitting: *Deleted

## 2021-03-29 DIAGNOSIS — Z3042 Encounter for surveillance of injectable contraceptive: Secondary | ICD-10-CM

## 2021-03-29 MED ORDER — MEDROXYPROGESTERONE ACETATE 150 MG/ML IM SUSP
150.0000 mg | Freq: Once | INTRAMUSCULAR | Status: AC
  Administered 2021-03-29: 150 mg via INTRAMUSCULAR

## 2021-03-29 NOTE — Progress Notes (Signed)
Agree with nurses's documentation of this patient's clinic encounter.  Casey Maxfield L, MD  

## 2021-03-29 NOTE — Progress Notes (Signed)
Ms. Kayla Ochoa presents today for Depo injection.   OBJECTIVE: Appears well, in no apparent distress.   ASSESSMENT:  Office stock Depo injection given.  PLAN: Pt due for AEX Return for Depo 4/20-06/28/21  Administrations This Visit     medroxyPROGESTERone (DEPO-PROVERA) injection 150 mg     Admin Date 03/29/2021 Action Given Dose 150 mg Route Intramuscular Administered By Lanney GinsFoster, Alejos Reinhardt D, CMA

## 2021-04-03 ENCOUNTER — Other Ambulatory Visit: Payer: Self-pay | Admitting: *Deleted

## 2021-04-03 ENCOUNTER — Encounter: Payer: Self-pay | Admitting: *Deleted

## 2021-04-03 MED ORDER — IBUPROFEN 800 MG PO TABS: 800.0000 mg | ORAL_TABLET | Freq: Three times a day (TID) | ORAL | 1 refills | Status: DC

## 2021-04-03 NOTE — Progress Notes (Signed)
Ibuprofen sent per MD approval.

## 2021-05-08 ENCOUNTER — Ambulatory Visit: Payer: Commercial Managed Care - PPO

## 2021-06-19 ENCOUNTER — Other Ambulatory Visit (HOSPITAL_COMMUNITY)
Admission: RE | Admit: 2021-06-19 | Discharge: 2021-06-19 | Disposition: A | Discharge status: Home / Self Care | Payer: Commercial Managed Care - PPO | Source: Ambulatory Visit | Attending: Advanced Practice Midwife | Admitting: Advanced Practice Midwife

## 2021-06-19 ENCOUNTER — Ambulatory Visit (INDEPENDENT_AMBULATORY_CARE_PROVIDER_SITE_OTHER): Payer: Commercial Managed Care - PPO | Admitting: Advanced Practice Midwife

## 2021-06-19 ENCOUNTER — Ambulatory Visit (INDEPENDENT_AMBULATORY_CARE_PROVIDER_SITE_OTHER): Payer: Commercial Managed Care - PPO

## 2021-06-19 ENCOUNTER — Encounter: Payer: Self-pay | Admitting: Advanced Practice Midwife

## 2021-06-19 VITALS — BP 115/82 | HR 90 | Ht 61.0 in | Wt 149.0 lb

## 2021-06-19 DIAGNOSIS — N644 Mastodynia: Secondary | ICD-10-CM

## 2021-06-19 DIAGNOSIS — Z3042 Encounter for surveillance of injectable contraceptive: Secondary | ICD-10-CM | POA: Diagnosis not present

## 2021-06-19 DIAGNOSIS — Z113 Encounter for screening for infections with a predominantly sexual mode of transmission: Secondary | ICD-10-CM

## 2021-06-19 DIAGNOSIS — Z3009 Encounter for other general counseling and advice on contraception: Secondary | ICD-10-CM

## 2021-06-19 DIAGNOSIS — Z1331 Encounter for screening for depression: Secondary | ICD-10-CM | POA: Diagnosis not present

## 2021-06-19 DIAGNOSIS — Z01419 Encounter for gynecological examination (general) (routine) without abnormal findings: Secondary | ICD-10-CM

## 2021-06-19 DIAGNOSIS — Z8481 Family history of carrier of genetic disease: Secondary | ICD-10-CM

## 2021-06-19 MED ORDER — MEDROXYPROGESTERONE ACETATE 150 MG/ML IM SUSP: 150.0000 mg | INTRAMUSCULAR | 4 refills | Status: DC

## 2021-06-19 MED ORDER — MEDROXYPROGESTERONE ACETATE 150 MG/ML IM SUSP
150.0000 mg | Freq: Once | INTRAMUSCULAR | Status: AC
  Administered 2021-06-19: 150 mg via INTRAMUSCULAR

## 2021-06-19 NOTE — Progress Notes (Signed)
SUBJECTIVE: Kayla Ochoa is a 26 y.o. female presents for DEPO Injection. ? ?OBJECTIVE: Appears well, in no apparent distress.  Vital signs are normal.  ? ?ASSESSMENT: Need for BC. AEX done today. ?Last PAP 02/22/2020 ? ?PLAN:  ?DEPO Injection given in RD, tolerated well. ? ?Next DEPO due July 12-26, 2023 ? ?Administrations This Visit   ? ? medroxyPROGESTERone (DEPO-PROVERA) injection 150 mg   ? ? Admin Date ?06/19/2021 Action ?Given Dose ?150 mg Route ?Intramuscular Administered By ?Tamela Oddi, RMA  ? ?  ?  ? ?  ?  ?

## 2021-06-19 NOTE — Progress Notes (Signed)
I have reviewed the chart and agree with the nurse's note and plan of care for this visit.  ? ?Sharen Counter, CNM ?5:36 PM ? ?

## 2021-06-19 NOTE — Progress Notes (Addendum)
? ?Subjective:  ?  ? Kayla Ochoa is a 26 y.o. female here at [facility] for a routine exam.  Current complaints: none. She is using Depo Provera for contraception and is happy with the method.  She has light irregular menses with Depo.  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  She has pain in her right breast and is concerned because her mother is a breast ca survivor at age 37. Personal health questionnaire reviewed: yes. ? ?Do you have a primary care provider? yes ?Do you feel safe at home? Yes ? ? ?  06/19/2021  ? 11:09 AM 02/22/2020  ? 11:05 AM  ?Depression screen PHQ 2/9  ?Decreased Interest 1 0  ?Down, Depressed, Hopeless 1 0  ?PHQ - 2 Score 2 0  ?Altered sleeping 3 3  ?Tired, decreased energy 1 1  ?Change in appetite 2 1  ?Feeling bad or failure about yourself  1 0  ?Trouble concentrating 0 0  ?Moving slowly or fidgety/restless 0 0  ?Suicidal thoughts 0 0  ?PHQ-9 Score 9 5  ?Difficult doing work/chores Not difficult at all Not difficult at all  ?  ?[place] Office Visit from 06/19/2021 in [place]  ?PHQ-2 Total Score 2  ? ?  ? ? ?Health Maintenance Due  ?Topic Date Due  ? TETANUS/TDAP  06/07/2019  ? COVID-19 Vaccine (3 - Booster for Pfizer series) 01/25/2020  ?  ? ?Risk factors for chronic health problems: ?Smoking: ?Alchohol/how much: ?Pt BMI: Body mass index is 28.15 kg/m?. ?  ?Gynecologic History ?No LMP recorded. Patient has had an injection. ?Contraception: Depo-Provera injections ?Last Pap: 02/22/2020. Results were: normal ?Last mammogram: n/a. ? ?Obstetric History ?OB History  ?Gravida Para Term Preterm AB Living  ?0 0 0 0 0 0  ?SAB IAB Ectopic Multiple Live Births  ?0 0 0 0 0  ? ? ? ?The following portions of the patient's history were reviewed and updated as appropriate: allergies, current medications, past family history, past medical history, past social history, past surgical history, and problem list. ? ?Review of  Systems ?Pertinent items noted in HPI and remainder of comprehensive ROS otherwise negative.  ?  ?Objective:  ? ?BP 115/82   Pulse 90   Ht _0  (1.549 m)   Wt 149 lb (67.6 kg)   BMI 28.15 kg/m?  ?VS reviewed, nursing note reviewed,  ?Constitutional: well developed, well nourished, no distress ?HEENT: normocephalic ?CV: normal rate ?Pulm/chest wall: normal effort ?Breast Exam:  performed: right breast normal without mass, skin or nipple changes or axillary nodes, left breast normal without mass, skin or nipple changes or axillary nodes ?Abdomen: soft ?Neuro: alert and oriented x 3 ?Skin: warm, dry ?Psych: affect normal ?Pelvic exam: Performed: Cervix pink, visually closed, without lesion, scant white creamy discharge, vaginal walls and external genitalia normal ?Bimanual exam: Cervix 0/long/high, firm, anterior, neg CMT, uterus nontender, nonenlarged, adnexa without tenderness, enlargement, or mass  ? ? ?   ?Assessment/Plan:  ?1. Well woman exam with routine gynecological exam ?--Pap due in 2024 ? ?2. Routine screening for STI (sexually transmitted infection) ? ?- Cervicovaginal ancillary only( [place]) ?- HIV antibody (with reflex) ?- Hepatitis C Antibody ? ?3. Encounter for counseling regarding contraception ?--Discussed pt contraceptive plans and reviewed contraceptive methods based on pt preferences and effectiveness.  Pt prefers to continue Depo. ?- medroxyPROGESTERone (DEPO-PROVERA) 150 MG/ML injection; Inject 1 mL (150 mg total) into the  muscle every 3 (three) months.  Dispense: 1 mL; Refill: 4 ?- medroxyPROGESTERone (DEPO-PROVERA) injection 150 mg ? ?4. Positive screening for depression on 9-item Patient Health Questionnaire (PHQ-9) ? ?- Ambulatory referral to [facility] ? ?5. Family history of breast cancer gene mutation in first degree relative ?--Pt mother is a survivor of breast cancer and is BRCA positive at age 4 ?- BRCAssure Comprehensive Panel  ? ?Addendum:  BRCAssure  costly with pt insurance, so [employer] rep to mail EMPOWER genetic test kit to pt home for her to complete. ? ? ?6. Pain of right breast ?--Exam today wnl, pt with tenderness of right breast, unusual for her, menses irregular with Depo so likely hormonal but given strong family hx and pt concerns, outpatient breast US ordered ? ?- US BREAST ASPIRATION RIGHT; Future  ? ?No follow-ups on file.  ? ?Fatima Blank, CNM ?11:20 AM   ?

## 2021-06-20 LAB — CERVICOVAGINAL ANCILLARY ONLY
Chlamydia: NEGATIVE
Comment: NEGATIVE
Comment: NEGATIVE
Comment: NORMAL
Neisseria Gonorrhea: NEGATIVE
Trichomonas: NEGATIVE

## 2021-06-20 LAB — HEPATITIS C ANTIBODY: Hep C Virus Ab: NONREACTIVE

## 2021-06-20 LAB — HIV ANTIBODY (ROUTINE TESTING W REFLEX): HIV Screen 4th Generation wRfx: NONREACTIVE

## 2021-06-24 ENCOUNTER — Encounter: Payer: Commercial Managed Care - PPO | Admitting: Licensed Clinical Social Worker

## 2021-06-25 ENCOUNTER — Encounter: Payer: Commercial Managed Care - PPO | Admitting: Licensed Clinical Social Worker

## 2021-07-02 ENCOUNTER — Ambulatory Visit
Admission: RE | Admit: 2021-07-02 | Discharge: 2021-07-02 | Disposition: A | Discharge status: Home / Self Care | Payer: Commercial Managed Care - PPO | Source: Ambulatory Visit | Attending: Advanced Practice Midwife | Admitting: Advanced Practice Midwife

## 2021-07-02 ENCOUNTER — Ambulatory Visit: Payer: Commercial Managed Care - PPO | Admitting: Advanced Practice Midwife

## 2021-07-02 DIAGNOSIS — N644 Mastodynia: Secondary | ICD-10-CM

## 2021-07-03 ENCOUNTER — Encounter: Payer: Self-pay | Admitting: Advanced Practice Midwife

## 2021-07-15 ENCOUNTER — Telehealth: Payer: Self-pay

## 2021-07-15 ENCOUNTER — Other Ambulatory Visit: Payer: Self-pay | Admitting: Advanced Practice Midwife

## 2021-07-15 NOTE — Telephone Encounter (Signed)
Contacted pt and advised that Empower home saliva test produced insufficient DNA, will need lab appt for blood draw, pt agreed.

## 2021-07-15 NOTE — Progress Notes (Signed)
EMPOWER test resulted with not enough DNA on saliva test.  Pt scheduled to return for blood test.

## 2021-07-25 ENCOUNTER — Ambulatory Visit (HOSPITAL_COMMUNITY)
Admission: EM | Admit: 2021-07-25 | Discharge: 2021-07-25 | Disposition: A | Discharge status: Home / Self Care | Payer: Commercial Managed Care - PPO | Attending: Psychiatry | Admitting: Psychiatry

## 2021-07-25 ENCOUNTER — Encounter (HOSPITAL_COMMUNITY): Payer: Self-pay | Admitting: Student

## 2021-07-25 DIAGNOSIS — R4589 Other symptoms and signs involving emotional state: Secondary | ICD-10-CM

## 2021-07-25 DIAGNOSIS — F331 Major depressive disorder, recurrent, moderate: Secondary | ICD-10-CM

## 2021-07-25 NOTE — ED Provider Notes (Signed)
Behavioral Health Urgent Care Medical Screening Exam  Patient Name: Kayla Ochoa MRN: [id number] Date of Evaluation: 07/25/21 Chief Complaint: hopelessness Diagnosis:  Final diagnoses:  Hopelessness  Moderate episode of recurrent major depressive disorder ([place])    History of Present illness:  Kayla Ochoa") is a 26 y.o. female presented to [facility] (07/25/2021) for depression with transient SI in the setting of dispute with sister and mom about cat x2 days. Waited until today a day to present, because this is her day off.  During the disputes, mom and sister stated that "we want to move out, toxic".  This made the patient feel hopeless, "what is the point of living if I upset my mom and sister." On 07/23/2021, patient had completed active SI about taking "more than one of my hydroxyzine". Stated that she was too scared to take to do so. Does endorse feeling ok if she were not to wake up the next day.  She reported mood lability with 0 to 100 anger that is difficult to redirect. However denied increased nrg and decreased need of sleep for 3-5days in a row with associating grandiosity, severe goal directed activity, psychosis. Patient's anger outburst are transient, within few hours, but will have feelings of resent and anxiety for multiple days.  Currently patient's biggest concern is the crying spells and feeling depressed.  She is on Lexapro 10 mg since January, adherent, prescribed by her PCP.  Also has hydroxyzine. Reported lexapro has helped and would like to continue it.   Currently denied SI, NSSIB, HI, AVH, paranoia.  Reported that she is able to be safe returning home and would contact mom, sister, step dad, dad, crisis hotline, 911 is she's having thoughts of SI/HI/AVH.   Psych ROS: +MDD, GAD, social anxiety, PTSD, borderline personality. No psychosis. No mania.   Past History: Psych hospitalization x1 in 2018 for psychosis 2/2 cannabis intoxication Never suicide attempt,  substance detox program, residential rehabilitation   Psychiatric Specialty Exam  Presentation  General Appearance:Appropriate for Environment; Casual; Fairly Groomed  Eye Contact:Good  Speech:Clear and Coherent; Normal Rate  Speech Volume:Normal  Handedness:Right   Mood and Affect  Mood:Depressed; Hopeless; Anxious  Affect:Congruent; Full Range   Thought Process  Thought Processes:Coherent; Goal Directed  Descriptions of Associations:Circumstantial  Orientation:Full (Time, Place and Person)  Thought Content:Rumination    Hallucinations:None  Ideas of Reference:None  Suicidal Thoughts:No  Homicidal Thoughts:No   Sensorium  Memory:Immediate Good; Recent Good; Remote Good  Judgment:Good  Insight:Fair   Executive Functions  Concentration:Good  Attention Span:Good  [place] of Knowledge:Good  Language:Good   Psychomotor Activity  Psychomotor Activity:Normal   Assets  Assets:No data recorded  Sleep  Sleep:Fair Number of hours: No data recorded  No data recorded  Physical Exam: Physical Exam Vitals and nursing note reviewed.  HENT:     Head: Normocephalic and atraumatic.  Pulmonary:     Effort: Pulmonary effort is normal.  Neurological:     General: No focal deficit present.     Mental Status: She is oriented to person, place, and time.   Review of Systems  Constitutional:  Positive for diaphoresis.  Respiratory:  Negative for shortness of breath.   Cardiovascular:  Positive for palpitations.  Gastrointestinal:  Negative for abdominal pain.  Neurological:  Negative for tremors, weakness and headaches.  Blood pressure 132/83, pulse 100, temperature 99.3 F (37.4 C), temperature source Oral, resp. rate 18, SpO2 99 %. There is no height or weight on file to calculate  BMI.  Musculoskeletal: Strength & Muscle Tone: within normal limits Gait & Station: normal Patient leans: N/A   [place] MSE Discharge Disposition for Follow  up and Recommendations: Based on my evaluation the patient does not appear to have an emergency medical condition and can be discharged with resources and follow up care in outpatient services for Medication Management, Individual Therapy, and Group Therapy   Assessment &  Kayla Ochoa") is a 26 y.o. female with PMH of MDD, GAD, uveitis who presented to [facility] (07/25/2021) for depression with transient SI after dispute with sister and mom about cat x2 days. Waited until today a day to present, because this is her day off.   Dx: MDD, GAD, social anxiety, PTSD, Borderline personality  Plan Does not meet requirement for OBS, FBC, IP Psych. Resources for OP therapy and psychiatry - DBT F/u with PCP to increase home Lexapro from 10mg  to 20 mg daily  Merrily Brittle, DO 07/25/2021, 12:50 PM

## 2021-07-25 NOTE — Discharge Instructions (Addendum)
For your behavioral health needs you are encouraged to follow up with psychiatric and psychotherapy as soon as possible upon discharge.  In case of another crisis, you can contact the Mobile Crisis Unit with [facility] at [phone].       [facility]      [address]      [place], [place] [address]      [phone]        [facility]      [phone] [address]      [place], [place] [address]      [phone]        [facility]      [address], [address]      [place], [place] [address]      [phone]   These are a few options you can contact, however, you have a right to provider choice if you find another provider that is more suitable to your needs.   Thank you for visiting [facility].

## 2021-07-25 NOTE — ED Notes (Signed)
Patient was discharged by the provider. Patient received an AVS with community resources.

## 2021-07-25 NOTE — BH Assessment (Signed)
[facility] Assessment Progress Note    Per Princess Bruins, DO , this pt does not require psychiatric hospitalization at this time.  Pt is psychiatrically cleared.  Discharge instructions include several options for psychiatric and therapy services.  Pt's doctor, Princess Bruins, DO, has been notified.  Doylene Canning, MA Triage Specialist [phone]

## 2021-07-30 ENCOUNTER — Other Ambulatory Visit: Payer: Commercial Managed Care - PPO

## 2021-07-30 DIAGNOSIS — Z1379 Encounter for other screening for genetic and chromosomal anomalies: Secondary | ICD-10-CM

## 2021-08-09 ENCOUNTER — Encounter: Payer: Self-pay | Admitting: Advanced Practice Midwife

## 2021-08-13 ENCOUNTER — Telehealth (HOSPITAL_COMMUNITY): Payer: Self-pay | Admitting: Family Medicine

## 2021-08-13 ENCOUNTER — Telehealth: Payer: Self-pay | Admitting: Advanced Practice Midwife

## 2021-08-13 ENCOUNTER — Telehealth: Payer: Self-pay

## 2021-08-13 NOTE — BH Assessment (Signed)
Care Management - [facility] Follow Up Discharges  ° °Writer attempted to make contact with patient today and was unsuccessful.  Writer left a HIPPA compliant voice message.  ° °Per chart review, patient was provided with outpatient resources. ° °

## 2021-08-13 NOTE — Telephone Encounter (Signed)
Patient called and left message requesting results.  Attempted to contact patient x1, no answer, unable to leave vm due to vm box being full.

## 2021-08-14 ENCOUNTER — Encounter: Payer: Self-pay | Admitting: Advanced Practice Midwife

## 2021-08-15 NOTE — Telephone Encounter (Signed)
I called Kayla Ochoa to discuss her recent EMPOWER genetic test results. 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  Ms. Nylander mother was diagnosed with breast cancer at age 24 and is now a breast cancer survivor.  The pt is anxious about these test results.  I reviewed the information given by Kayla Ochoa about her lifetime risk, which is 40-60% for breast cancer and answered questions.    Kayla Ochoa had an ultrasound at the [facility] and will call to notify the MD there about these results so that follow up can be scheduled.   Kayla Ochoa offers free testing for first degree family members and Kayla Ochoa ordered home test kits for her 2 sisters which will be mailed from [place].  Pt to follow up the the [facility], and in our office for scheduled visits.

## 2021-09-04 ENCOUNTER — Ambulatory Visit
Admission: EM | Admit: 2021-09-04 | Discharge: 2021-09-04 | Disposition: A | Discharge status: Home / Self Care | Payer: Commercial Managed Care - PPO

## 2021-09-04 DIAGNOSIS — M069 Rheumatoid arthritis, unspecified: Secondary | ICD-10-CM

## 2021-09-04 DIAGNOSIS — M25531 Pain in right wrist: Secondary | ICD-10-CM | POA: Diagnosis not present

## 2021-09-04 MED ORDER — IBUPROFEN 800 MG PO TABS: 800.0000 mg | ORAL_TABLET | Freq: Three times a day (TID) | ORAL | 0 refills | Status: AC

## 2021-09-04 NOTE — ED Triage Notes (Signed)
Pt c/o right wrist pain x several weeks. States it feels different than her normal rheumatoid arthritic pain.

## 2021-09-04 NOTE — Discharge Instructions (Addendum)
Take Motrin and use wrist splint for the next week follow-up with your rheumatoid specialist if not improving for reevaluation return for any new or worse symptoms for reevaluation

## 2021-09-04 NOTE — ED Provider Notes (Signed)
Kayla Ochoa - URGENT CARE CENTER   MRN: [id number] DOB: [birth year]  Subjective:   Chief Complaint;  Chief Complaint  Patient presents with   right wrist pain  Pt c/o right wrist pain x several weeks. States it feels different than her normal rheumatoid arthritic pain  Kayla Ochoa is a 26 y.o. female presenting for right wrist pain for the last several weeks.  Patient states this is different from her normal rheumatoid arthritic pain.  She denies taking any medications currently.  She works typing a lot and feels that maybe she has been doing too much typing lately.  She denies fever or significant swelling and has had no trauma to the area  No current facility-administered medications for this encounter.  Current Outpatient Medications:    ibuprofen (ADVIL) 800 MG tablet, Take 1 tablet (800 mg total) by mouth 3 (three) times daily., Disp: 21 tablet, Rfl: 0   albuterol (VENTOLIN HFA) 108 (90 Base) MCG/ACT inhaler, Inhale 1-2 puffs into the lungs every 4 (four) hours as needed for wheezing or shortness of breath., Disp: 1 each, Rfl: 0   busPIRone (BUSPAR) 7.5 MG tablet, Take 7.5 mg by mouth once., Disp: , Rfl:    ciprofloxacin (CIPRO) 250 MG tablet, Take 1 tablet (250 mg total) by mouth every 12 (twelve) hours. (Patient not taking: Reported on 03/04/2021), Disp: 10 tablet, Rfl: 0   escitalopram (LEXAPRO) 10 MG tablet, Take 10 mg by mouth daily., Disp: , Rfl:    flavoxATE (URISPAS) 100 MG tablet, Take by mouth., Disp: , Rfl:    hydrOXYzine (ATARAX) 25 MG tablet, Take 25 mg by mouth at bedtime., Disp: , Rfl:    lamoTRIgine (LAMICTAL) 100 MG tablet, Take 100 mg by mouth daily., Disp: , Rfl:    medroxyPROGESTERone (DEPO-PROVERA) 150 MG/ML injection, Inject into the muscle., Disp: , Rfl:    medroxyPROGESTERone (DEPO-PROVERA) 150 MG/ML injection, Inject 1 mL (150 mg total) into the muscle every 3 (three) months., Disp: 1 mL, Rfl: 4   omeprazole (PRILOSEC) 40 MG capsule, Take 40 mg by mouth daily.,  Disp: , Rfl:    Allergies  Allergen Reactions   Iodine Anaphylaxis   Fish Allergy Diarrhea and Swelling    Swelling to face. Tested positive   Other Swelling    Tree nuts Pecans strawberries   Shrimp [Shellfish Allergy] Swelling    Past Medical History:  Diagnosis Date   Anxiety    Asthma    Depression    HSV infection    Juvenile rheumatoid arthritis (HCC)    Uveitis      Review of Systems  All other systems reviewed and are negative.    Objective:   Vitals: BP 114/80 (BP Location: Left Arm)   Pulse 100   Temp 98.4 F (36.9 C) (Oral)   Resp 18   SpO2 98%   Physical Exam Vitals and nursing note reviewed.  Constitutional:      General: She is not in acute distress.    Appearance: Normal appearance. She is normal weight. She is not ill-appearing or toxic-appearing.  HENT:     Head: Normocephalic and atraumatic.     Right Ear: External ear normal.     Left Ear: External ear normal.  Eyes:     Conjunctiva/sclera: Conjunctivae normal.  Pulmonary:     Effort: Pulmonary effort is normal.  Musculoskeletal:     Comments: Right wrist with tenderness diffusely no gross swelling or bruising mild limitation of range of motion due to pain  in all direction.  Couple tunnel tests are negative no NVD D  Neurological:     Mental Status: She is alert.  Psychiatric:        Mood and Affect: Mood normal.        Behavior: Behavior normal.     No results found for this or any previous visit (from the past 24 hour(s)).  No results found.     Assessment and Plan :   1. Right wrist pain   2. Rheumatoid arthritis involving right wrist, unspecified whether rheumatoid factor present (HCC)     Meds ordered this encounter  Medications   ibuprofen (ADVIL) 800 MG tablet    Sig: Take 1 tablet (800 mg total) by mouth 3 (three) times daily.    Dispense:  21 tablet    Refill:  0    Order Specific Question:   Supervising Provider    Answer:   Merrilee Jansky [[id number]]     MDM:  Kayla Ochoa is a 26 y.o. female presenting for right wrist pain from repetitive motions.  On exam today her tests for carpal tunnel were negative there was no gross swelling or deformity.  She has mild pain on exam diffusely to the wrist and pain in all directions of movement.  I ordered a wrist splint and since she was taking no regular medications for anti-inflammatory we chose Motrin 800 short-term to be taken along with Tylenol Extra Strength 2 tablets she will follow-up with her rheumatoid arthritis specialist.  Patient is agreeable with this plan.  I discussed todays findings, treatment plan, follow up and return instructions. Questions were answered. Patient/representative stated understanding of the instructions and patient is stable for discharge.  Kayla Conger FNP-C MSN    Kayla Baseman, NP 09/04/21 [phone]

## 2021-09-11 ENCOUNTER — Ambulatory Visit (INDEPENDENT_AMBULATORY_CARE_PROVIDER_SITE_OTHER): Payer: Commercial Managed Care - PPO

## 2021-09-11 VITALS — Wt 165.1 lb

## 2021-09-11 DIAGNOSIS — Z3042 Encounter for surveillance of injectable contraceptive: Secondary | ICD-10-CM

## 2021-09-11 MED ORDER — MEDROXYPROGESTERONE ACETATE 150 MG/ML IM SUSP
150.0000 mg | INTRAMUSCULAR | Status: AC
  Administered 2021-09-11 – 2022-08-19 (×3): 150 mg via INTRAMUSCULAR

## 2021-09-11 NOTE — Progress Notes (Signed)
Pt is in the office for depo injection, administered in [place] and pt tolerated well. Next due Oct 4-18 .Marland Kitchen Administrations This Visit     medroxyPROGESTERone (DEPO-PROVERA) injection 150 mg     Admin Date 09/11/2021 Action Given Dose 150 mg Route Intramuscular Administered By Katrina Stack, RN

## 2021-10-30 ENCOUNTER — Ambulatory Visit
Admission: EM | Admit: 2021-10-30 | Discharge: 2021-10-30 | Disposition: A | Discharge status: Home / Self Care | Payer: Commercial Managed Care - PPO | Attending: Physician Assistant | Admitting: Physician Assistant

## 2021-10-30 DIAGNOSIS — K120 Recurrent oral aphthae: Secondary | ICD-10-CM | POA: Diagnosis not present

## 2021-10-30 MED ORDER — LIDOCAINE VISCOUS HCL 2 % MT SOLN: 15.0000 mL | OROMUCOSAL | 0 refills | Status: DC | PRN

## 2021-10-30 NOTE — ED Triage Notes (Signed)
Pt presents with canker sore X 4 days.

## 2021-10-30 NOTE — ED Provider Notes (Signed)
[name]    CSN: [id number] Arrival date & time: 10/30/21  1250      History   Chief Complaint Chief Complaint  Patient presents with   Abscess    HPI Kayla Ochoa is a 26 y.o. female.   Patient here today for evaluation of canker sore to her lower central gums.  She reports that it has been present for 4 days and she has had these in the past but typically did not last as long.  Talking and eating seem to worsen pain.  She has not had any fevers.  She denies any other symptoms.  She has tried using over-the-counter oral gel without significant improvement.  The history is provided by the patient.    Past Medical History:  Diagnosis Date   Anxiety    Asthma    Depression    HSV infection    Juvenile rheumatoid arthritis (HCC)    Uveitis     Patient Active Problem List   Diagnosis Date Noted   Family history of breast cancer gene mutation in first degree relative 06/19/2021   Depo-Provera contraceptive status 06/19/2021   Traumatic avulsion of nail plate of toe 02/54/2706   Cannabis use disorder, mild, abuse 02/22/2020   Major depressive disorder, recurrent episode, severe, with psychosis (HCC) 01/08/2017   Major depressive disorder, recurrent, severe with psychotic features (HCC) 01/08/2017   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 Authorizing Provider  lidocaine (XYLOCAINE) 2 % solution Use as directed 15 mLs in the mouth or throat as needed for mouth pain. 10/30/21  Yes [name], PA-C  albuterol (VENTOLIN HFA) 108 (90  Base) MCG/ACT inhaler Inhale 1-2 puffs into the lungs every 4 (four) hours as needed for wheezing or shortness of breath. 03/04/21   [name], MD  busPIRone (BUSPAR) 7.5 MG tablet Take 7.5 mg by mouth once. Patient not taking: Reported on 09/11/2021    [provider]  ciprofloxacin (CIPRO) 250 MG tablet Take 1 tablet (250 mg total) by mouth every 12 (twelve) hours. Patient not taking: Reported on 03/04/2021 12/27/20   [name], PA-C  escitalopram (LEXAPRO) 10 MG tablet Take 20 mg by mouth daily. 06/16/21   [provider]  flavoxATE (URISPAS) 100 MG tablet Take by mouth. Patient not taking: Reported on 09/11/2021 06/18/21   [provider]  hydrOXYzine (ATARAX) 25 MG tablet Take 25 mg by mouth at bedtime.    [provider]  ibuprofen (ADVIL) 800 MG tablet Take 1 tablet (800 mg total) by mouth 3 (three) times daily. 09/04/21   [name], NP  lamoTRIgine (LAMICTAL) 100 MG tablet Take 100 mg by mouth daily. 09/03/21   [provider]  medroxyPROGESTERone (DEPO-PROVERA) 150 MG/ML injection Inject into the muscle. Patient not taking: Reported on 09/11/2021  01/04/21   [provider]  medroxyPROGESTERone (DEPO-PROVERA) 150 MG/ML injection Inject 1 mL (150 mg total) into the muscle every 3 (three) months. 06/19/21   [name], Wilmer Floor, CNM  omeprazole (PRILOSEC) 40 MG capsule Take 40 mg by mouth daily. Patient not taking: Reported on 09/11/2021 06/16/21   [provider]    Family History Family History  Problem Relation Age of Onset   Cancer Mother    Schizophrenia Maternal Grandmother    Diabetes Maternal Grandfather    Breast cancer Maternal Aunt    Diabetes Paternal Aunt     Social History Social History   Tobacco Use   Smoking status: Former    Types: Cigars   Smokeless tobacco: Never  Vaping Use   Vaping Use: Never used  Substance Use Topics   Alcohol use: No   Drug use: Not Currently    Types:  Other-see comments    Comment: CBD     Allergies   Iodine, Fish allergy, Other, and Shrimp [shellfish allergy]   Review of Systems Review of Systems  Constitutional:  Negative for chills and fever.  HENT:  Positive for mouth sores. Negative for congestion.   Eyes:  Negative for discharge and redness.  Gastrointestinal:  Negative for abdominal pain, nausea and vomiting.     Physical Exam Triage Vital Signs ED Triage Vitals [10/30/21 1348]  Enc Vitals Group     BP 120/81     Pulse Rate (!) 101     Resp 16     Temp 97.9 F (36.6 C)     Temp Source Oral     SpO2 97 %     Weight      Height      Head Circumference      Peak Flow      Pain Score      Pain Loc      Pain Edu?      Excl. in [place]?    No data found.  Updated Vital Signs BP 120/81 (BP Location: Left Arm)   Pulse (!) 101   Temp 97.9 F (36.6 C) (Oral)   Resp 16   SpO2 97%      Physical Exam Vitals and nursing note reviewed.  Constitutional:      General: She is not in acute distress.    Appearance: Normal appearance. She is not ill-appearing.  HENT:     Head: Normocephalic and atraumatic.     Nose: Nose normal. No congestion or rhinorrhea.     Mouth/Throat:     Mouth: Mucous membranes are moist.     Pharynx: Oropharynx is clear. No posterior oropharyngeal erythema.     Comments: Single 2 mm ulceration just right of midline lower gum line/ inner lip Eyes:     Conjunctiva/sclera: Conjunctivae normal.  Cardiovascular:     Rate and Rhythm: Normal rate.  Pulmonary:     Effort: Pulmonary effort is normal.  Neurological:     Mental Status: She is alert.  Psychiatric:        Mood and Affect: Mood normal.        Behavior: Behavior normal.        Thought Content: Thought content normal.      UC Treatments / Results  Labs (all labs ordered are listed, but only abnormal results are displayed) Labs Reviewed - No data to display  EKG   Radiology No results found.  Procedures Procedures  (including critical care time)  Medications Ordered in  UC Medications - No data to display  Initial Impression / Assessment and Plan / UC Course  I have reviewed the triage vital signs and the nursing notes.  Pertinent labs & imaging results that were available during my care of the patient were reviewed by me and considered in my medical decision making (see chart for details).    Viscous lidocaine prescribed for hopeful symptom improvement.  Recommended follow-up if no gradual resolution or with any further concerns.  Final Clinical Impressions(s) / UC Diagnoses   Final diagnoses:  Canker sore   Discharge Instructions   None    ED Prescriptions     Medication Sig Dispense Auth. Provider   lidocaine (XYLOCAINE) 2 % solution Use as directed 15 mLs in the mouth or throat as needed for mouth pain. 100 mL [name], PA-C      PDMP not reviewed this encounter.   [name], PA-C 10/30/21 1511

## 2021-12-02 ENCOUNTER — Ambulatory Visit
Admission: EM | Admit: 2021-12-02 | Discharge: 2021-12-02 | Disposition: A | Discharge status: Home / Self Care | Payer: Commercial Managed Care - PPO | Attending: Physician Assistant | Admitting: Physician Assistant

## 2021-12-02 DIAGNOSIS — N3281 Overactive bladder: Secondary | ICD-10-CM | POA: Diagnosis not present

## 2021-12-02 MED ORDER — FLAVOXATE HCL 100 MG PO TABS: 100.0000 mg | ORAL_TABLET | Freq: Three times a day (TID) | ORAL | 0 refills | Status: DC | PRN

## 2021-12-02 MED ORDER — FLAVOXATE HCL 100 MG PO TABS
100.0000 mg | ORAL_TABLET | Freq: Three times a day (TID) | ORAL | 0 refills | Status: DC | PRN
Start: 2021-12-02 — End: 2021-12-02

## 2021-12-02 NOTE — ED Provider Notes (Signed)
[name]    CSN: [id number] Arrival date & time: 12/02/21  1825      History   Chief Complaint Chief Complaint  Patient presents with   overreactive bladder    HPI Kayla Ochoa is a 26 y.o. female.   Patient here today for evaluation of overactive bladder.  She reports that she would like refill of her flavoxate if possible.  She has taken this in the past and has worked well.  She denies any side effects of same.  She does plan follow-up with PCP in the near future.  The history is provided by the patient.    Past Medical History:  Diagnosis Date   Anxiety    Asthma    Depression    HSV infection    Juvenile rheumatoid arthritis ([place])    Uveitis     Patient Active Problem List   Diagnosis Date Noted   Family history of breast cancer gene mutation in first degree relative 06/19/2021   Depo-Provera contraceptive status 06/19/2021   Traumatic avulsion of nail plate of toe [id number]   Cannabis use disorder, mild, abuse 02/22/2020   Major depressive disorder, recurrent episode, severe, with psychosis ([place]) 01/08/2017   Major depressive disorder, recurrent, severe with psychotic features ([place]) 01/08/2017   Juvenile rheumatoid arthritis ([place]) 11/17/2011    Past Surgical History:  Procedure Laterality Date   TYMPANOSTOMY TUBE PLACEMENT Bilateral 2001   WISDOM TOOTH EXTRACTION Bilateral     OB History     Gravida  0   Para  0   Term  0   Preterm  0   AB  0   Living  0      SAB  0   IAB  0   Ectopic  0   Multiple  0   Live Births  0            Home Medications    Prior to Admission medications   Medication Sig Start Date End Date Taking? Authorizing Provider  albuterol (VENTOLIN HFA) 108 (90 Base) MCG/ACT inhaler Inhale 1-2 puffs into the lungs every 4 (four) hours as needed for wheezing or shortness of breath. 03/04/21   Barrett Henle, MD  busPIRone (BUSPAR) 7.5 MG tablet Take 7.5 mg by mouth once. Patient not  taking: Reported on 09/11/2021    [provider]  ciprofloxacin (CIPRO) 250 MG tablet Take 1 tablet (250 mg total) by mouth every 12 (twelve) hours. Patient not taking: Reported on 03/04/2021 12/27/20   Lynden Oxford Scales, PA-C  escitalopram (LEXAPRO) 10 MG tablet Take 20 mg by mouth daily. 06/16/21   [provider]  flavoxATE (URISPAS) 100 MG tablet Take 1 tablet (100 mg total) by mouth 3 (three) times daily as needed for bladder spasms. 12/02/21   Francene Finders, PA-C  hydrOXYzine (ATARAX) 25 MG tablet Take 25 mg by mouth at bedtime.    [provider]  ibuprofen (ADVIL) 800 MG tablet Take 1 tablet (800 mg total) by mouth 3 (three) times daily. 09/04/21   Hezzie Bump, NP  lamoTRIgine (LAMICTAL) 100 MG tablet Take 100 mg by mouth daily. 09/03/21   [provider]  lidocaine (XYLOCAINE) 2 % solution Use as directed 15 mLs in the mouth or throat as needed for mouth pain. 10/30/21   Francene Finders, PA-C  medroxyPROGESTERone (DEPO-PROVERA) 150 MG/ML injection Inject into the muscle. Patient not taking: Reported on 09/11/2021 01/04/21   [provider]  medroxyPROGESTERone (DEPO-PROVERA)  150 MG/ML injection Inject 1 mL (150 mg total) into the muscle every 3 (three) months. 06/19/21   [name], Kathie Dike, CNM  omeprazole (PRILOSEC) 40 MG capsule Take 40 mg by mouth daily. Patient not taking: Reported on 09/11/2021 06/16/21   [provider]    Family History Family History  Problem Relation Age of Onset   Cancer Mother    Schizophrenia Maternal Grandmother    Diabetes Maternal Grandfather    Breast cancer Maternal Aunt    Diabetes Paternal Aunt     Social History Social History   Tobacco Use   Smoking status: Former    Types: Cigars   Smokeless tobacco: Never  Vaping Use   Vaping Use: Never used  Substance Use Topics   Alcohol use: No   Drug use: Not Currently    Types: Other-see comments    Comment: CBD     Allergies    Iodine, Fish allergy, Other, and Shrimp [shellfish allergy]   Review of Systems Review of Systems  Constitutional:  Negative for chills and fever.  Eyes:  Negative for discharge and redness.  Respiratory:  Negative for shortness of breath.   Gastrointestinal:  Negative for abdominal pain, nausea and vomiting.  Genitourinary:  Positive for frequency. Negative for dysuria.  Musculoskeletal:  Negative for back pain.     Physical Exam Triage Vital Signs ED Triage Vitals  Enc Vitals Group     BP 12/02/21 1914 111/70     Pulse Rate 12/02/21 1914 98     Resp 12/02/21 1914 16     Temp 12/02/21 1914 98 F (36.7 C)     Temp Source 12/02/21 1914 Oral     SpO2 12/02/21 1914 98 %     Weight --      Height --      Head Circumference --      Peak Flow --      Pain Score 12/02/21 1915 0     Pain Loc --      Pain Edu? --      Excl. in [place]? --    No data found.  Updated Vital Signs BP 111/70 (BP Location: Left Arm)   Pulse 98   Temp 98 F (36.7 C) (Oral)   Resp 16   SpO2 98%      Physical Exam Vitals and nursing note reviewed.  Constitutional:      General: She is not in acute distress.    Appearance: Normal appearance. She is not ill-appearing.  HENT:     Head: Normocephalic and atraumatic.  Eyes:     Conjunctiva/sclera: Conjunctivae normal.  Cardiovascular:     Rate and Rhythm: Normal rate.  Pulmonary:     Effort: Pulmonary effort is normal. No respiratory distress.  Neurological:     Mental Status: She is alert.  Psychiatric:        Mood and Affect: Mood normal.        Behavior: Behavior normal.        Thought Content: Thought content normal.      UC Treatments / Results  Labs (all labs ordered are listed, but only abnormal results are displayed) Labs Reviewed - No data to display  EKG   Radiology No results found.  Procedures Procedures (including critical care time)  Medications Ordered in UC Medications - No data to display  Initial Impression  / Assessment and Plan / UC Course  I have reviewed the triage vital signs and the nursing notes.  Pertinent labs & imaging results that were available during my care of the patient were reviewed by me and considered in my medical decision making (see chart for details).    Medication refilled as requested.  Recommended follow-up with PCP as scheduled.  Encouraged sooner follow-up with any further concerns.  Final Clinical Impressions(s) / UC Diagnoses   Final diagnoses:  Overactive bladder   Discharge Instructions   None    ED Prescriptions     Medication Sig Dispense Auth. Provider   flavoxATE (URISPAS) 100 MG tablet  (Status: Discontinued) Take 1 tablet (100 mg total) by mouth 3 (three) times daily as needed for bladder spasms. 10 tablet Ewell Poe F, PA-C   flavoxATE (URISPAS) 100 MG tablet Take 1 tablet (100 mg total) by mouth 3 (three) times daily as needed for bladder spasms. 10 tablet Francene Finders, PA-C      PDMP not reviewed this encounter.   Francene Finders, PA-C 12/02/21 1955

## 2021-12-02 NOTE — ED Triage Notes (Signed)
Pt concerned for overreactive bladder. States had this dx from a PCP and tx but medicaid did not cover the tx. Here for alternative.   Flavoxate was helpful but states it was meant to be temporary and provider gave her a secondary medication after this which is the one that was not covered.

## 2021-12-05 ENCOUNTER — Ambulatory Visit (INDEPENDENT_AMBULATORY_CARE_PROVIDER_SITE_OTHER): Payer: Commercial Managed Care - PPO | Admitting: *Deleted

## 2021-12-05 VITALS — BP 128/85 | HR 94

## 2021-12-05 DIAGNOSIS — Z3042 Encounter for surveillance of injectable contraceptive: Secondary | ICD-10-CM | POA: Diagnosis not present

## 2021-12-05 MED ORDER — MEDROXYPROGESTERONE ACETATE 150 MG/ML IM SUSP
150.0000 mg | Freq: Once | INTRAMUSCULAR | Status: AC
  Administered 2021-12-05: 150 mg via INTRAMUSCULAR

## 2021-12-05 NOTE — Progress Notes (Signed)
Date last pap: (annual) 06/19/21 Last Depo-Provera: 09/11/21. Side Effects if any: NA. Serum HCG indicated? NA. Depo-Provera 150 mg IM given by: Lillia Abed. Lazarus Salines, RNC in RD. Next appointment due 02/20/22-03/06/22.

## 2021-12-21 ENCOUNTER — Encounter (HOSPITAL_COMMUNITY): Payer: Self-pay | Admitting: Emergency Medicine

## 2021-12-21 ENCOUNTER — Other Ambulatory Visit: Payer: Self-pay

## 2021-12-21 ENCOUNTER — Emergency Department (HOSPITAL_COMMUNITY)
Admission: EM | Admit: 2021-12-21 | Discharge: 2021-12-22 | Disposition: A | Discharge status: Home / Self Care | Payer: Commercial Managed Care - PPO | Attending: Emergency Medicine | Admitting: Emergency Medicine

## 2021-12-21 DIAGNOSIS — N3000 Acute cystitis without hematuria: Secondary | ICD-10-CM

## 2021-12-21 DIAGNOSIS — N3289 Other specified disorders of bladder: Secondary | ICD-10-CM

## 2021-12-21 NOTE — ED Provider Triage Note (Signed)
Emergency Medicine Provider Triage Evaluation Note  Kayla Ochoa , a 26 y.o. female  was evaluated in triage.  Pt complains of bladder spasms she currently takes flavoxate ports that she is about to take her last pill and there has not been any improvement in symptoms.  States "I need to get to the bottom of this ".  Her last menstrual cycle is unknown as she is currently on the Depo-Provera shot.  Nuys any vaginal discharge, vaginal bleeding..  Review of Systems  Positive: bladder Negative: Vaginal bleeding, vaginal discharge  Physical Exam  BP 126/84   Pulse (!) 105   Temp 98.5 F (36.9 C) (Oral)   Resp 16   SpO2 100%  Gen:   Awake, no distress   Resp:  Normal effort  MSK:   Moves extremities without difficulty  Other:    Medical Decision Making  Medically screening exam initiated at 11:27 PM.  Appropriate orders placed.  Kayla Ochoa was informed that the remainder of the evaluation will be completed by another provider, this initial triage assessment does not replace that evaluation, and the importance of remaining in the ED until their evaluation is complete.     Janeece Fitting, PA-C 12/21/21 2331

## 2021-12-21 NOTE — ED Triage Notes (Signed)
Pt reported to ED with concerns of bladder spasms.  States she has been evaluated by PCP and told that she does not have UTI and given medication for overactive bladder but medication has not provided relief in symptoms.

## 2021-12-22 LAB — URINALYSIS, ROUTINE W REFLEX MICROSCOPIC
Bilirubin Urine: NEGATIVE
Glucose, UA: NEGATIVE mg/dL
Hgb urine dipstick: NEGATIVE
Ketones, ur: NEGATIVE mg/dL
Leukocytes,Ua: NEGATIVE
Nitrite: POSITIVE — AB
Protein, ur: NEGATIVE mg/dL
Specific Gravity, Urine: >1.03 — ABNORMAL HIGH (ref 1.005–1.030)
pH: 6 (ref 5.0–8.0)

## 2021-12-22 LAB — URINALYSIS, MICROSCOPIC (REFLEX)

## 2021-12-22 LAB — PREGNANCY, URINE: Preg Test, Ur: NEGATIVE

## 2021-12-22 MED ORDER — CEPHALEXIN 500 MG PO CAPS: 500.0000 mg | ORAL_CAPSULE | Freq: Two times a day (BID) | ORAL | 0 refills | Status: AC

## 2021-12-22 MED ORDER — OXYBUTYNIN CHLORIDE ER 5 MG PO TB24: 5.0000 mg | ORAL_TABLET | Freq: Two times a day (BID) | ORAL | 0 refills | Status: AC

## 2021-12-22 NOTE — Discharge Instructions (Signed)
Your urine was positive for a UTI today.  I have sent you an antibiotic that you will take twice daily for 5 days.  I have also sent your urine for culture, so you will receive a phone call if the prescribed antibiotic needs to be changed.  Additionally I have sent you in a bladder spasm medication to help with some of your discomfort and I would recommend they continue to take Azo until symptoms have resolved.  Follow-up with your PCP as needed.

## 2021-12-22 NOTE — ED Provider Notes (Signed)
[facility] Provider Note   CSN: [id number] Arrival date & time: 12/21/21  2245     History  Chief Complaint  Patient presents with   Spasms    Kayla Ochoa is a 26 y.o. female who presents to the emergency department for evaluation of urinary discomfort and spasms.  Patient states that she has increased urinary frequency and when she urinates, the muscle spasms and refuses to relax.  She went to urgent care about 3 weeks ago and was told she had overactive bladder and was prescribed flavoxate..  She believes symptoms have not improved and she continues to have significant discomfort.  She denies fever, chills, abdominal pain, hematuria.  She has also been trying Azo over-the-counter  HPI     Home Medications Prior to Admission medications   Medication Sig Start Date End Date Taking? Authorizing Provider  cephALEXin (KEFLEX) 500 MG capsule Take 1 capsule (500 mg total) by mouth 2 (two) times daily for 5 days. 12/22/21 12/27/21 Yes Kathe Becton R, PA-C  oxybutynin (DITROPAN XL) 5 MG 24 hr tablet Take 1 tablet (5 mg total) by mouth in the morning and at bedtime for 7 days. 12/22/21 12/29/21 Yes Tonye Pearson, PA-C  albuterol (VENTOLIN HFA) 108 (90 Base) MCG/ACT inhaler Inhale 1-2 puffs into the lungs every 4 (four) hours as needed for wheezing or shortness of breath. 03/04/21   Barrett Henle, MD  busPIRone (BUSPAR) 7.5 MG tablet Take 7.5 mg by mouth once. Patient not taking: Reported on 09/11/2021    [provider]  ciprofloxacin (CIPRO) 250 MG tablet Take 1 tablet (250 mg total) by mouth every 12 (twelve) hours. Patient not taking: Reported on 03/04/2021 12/27/20   Lynden Oxford Scales, PA-C  escitalopram (LEXAPRO) 10 MG tablet Take 20 mg by mouth daily. 06/16/21   [provider]  flavoxATE (URISPAS) 100 MG tablet Take 1 tablet (100 mg total) by mouth 3 (three) times daily as needed for bladder spasms. 12/02/21   Francene Finders, PA-C  hydrOXYzine (ATARAX) 25 MG tablet Take 25 mg by mouth at bedtime.    [provider]  ibuprofen (ADVIL) 800 MG tablet Take 1 tablet (800 mg total) by mouth 3 (three) times daily. 09/04/21   Hezzie Bump, NP  lamoTRIgine (LAMICTAL) 100 MG tablet Take 100 mg by mouth daily. 09/03/21   [provider]  lidocaine (XYLOCAINE) 2 % solution Use as directed 15 mLs in the mouth or throat as needed for mouth pain. 10/30/21   Francene Finders, PA-C  medroxyPROGESTERone (DEPO-PROVERA) 150 MG/ML injection Inject into the muscle. Patient not taking: Reported on 09/11/2021 01/04/21   [provider]  medroxyPROGESTERone (DEPO-PROVERA) 150 MG/ML injection Inject 1 mL (150 mg total) into the muscle every 3 (three) months. 06/19/21   [name], Kathie Dike, CNM  omeprazole (PRILOSEC) 40 MG capsule Take 40 mg by mouth daily. Patient not taking: Reported on 09/11/2021 06/16/21   [provider]      Allergies    Iodine, Fish allergy, Other, and Shrimp [shellfish allergy]    Review of Systems   Review of Systems  Physical Exam Updated Vital Signs BP 113/78 (BP Location: Left Arm)   Pulse 84   Temp 98.5 F (36.9 C)   Resp 18   SpO2 100%  Physical Exam Vitals and nursing note reviewed.  Constitutional:      General: She is not in acute distress.    Appearance: She is not  ill-appearing.  HENT:     Head: Atraumatic.  Eyes:     Conjunctiva/sclera: Conjunctivae normal.  Cardiovascular:     Rate and Rhythm: Normal rate and regular rhythm.     Pulses: Normal pulses.     Heart sounds: No murmur heard. Pulmonary:     Effort: Pulmonary effort is normal. No respiratory distress.     Breath sounds: Normal breath sounds.  Abdominal:     General: Abdomen is flat. There is no distension.     Palpations: Abdomen is soft.     Tenderness: There is no abdominal tenderness.  Musculoskeletal:        General: Normal range of motion.     Cervical back: Normal range  of motion.  Skin:    General: Skin is warm and dry.     Capillary Refill: Capillary refill takes less than 2 seconds.  Neurological:     General: No focal deficit present.     Mental Status: She is alert.  Psychiatric:        Mood and Affect: Mood normal.     ED Results / Procedures / Treatments   Labs (all labs ordered are listed, but only abnormal results are displayed) Labs Reviewed  URINALYSIS, ROUTINE W REFLEX MICROSCOPIC - Abnormal; Notable for the following components:      Result Value   APPearance CLOUDY (*)    Specific Gravity, Urine >1.030 (*)    Nitrite POSITIVE (*)    All other components within normal limits  URINALYSIS, MICROSCOPIC (REFLEX) - Abnormal; Notable for the following components:   Bacteria, UA MANY (*)    All other components within normal limits  URINE CULTURE  PREGNANCY, URINE    EKG None  Radiology No results found.  Procedures Procedures    Medications Ordered in ED Medications - No data to display  ED Course/ Medical Decision Making/ A&P                           Medical Decision Making Amount and/or Complexity of Data Reviewed Labs: ordered.  Risk Prescription drug management.   26 year old female presents to the emergency department for evaluation of urinary discomfort and spasms.  Differentials include overactive bladder, UTI.  Vitals without significant abnormality.  Urinalysis ordered in triage nitrite positive urinary tract infection.  Negative pregnancy test.  I did order a urine culture given that she has been having symptoms for quite some time.  Discharged home with Keflex, oxybutynin and advised to continue Azo as needed.  Follow-up with PCP as needed.  Patient agreeable with plan.  Discharged home in stable condition Final Clinical Impression(s) / ED Diagnoses Final diagnoses:  Acute cystitis without hematuria  Bladder spasm    Rx / DC Orders ED Discharge Orders          Ordered    oxybutynin (DITROPAN XL) 5 MG  24 hr tablet  2 times daily        12/22/21 1000    cephALEXin (KEFLEX) 500 MG capsule  2 times daily        12/22/21 1000              Janell Quiet, [place] 12/22/21 1005    Mardene Sayer, MD 12/22/21 1200

## 2021-12-24 LAB — URINE CULTURE: Culture: >=100000 — AB

## 2021-12-25 ENCOUNTER — Telehealth (HOSPITAL_BASED_OUTPATIENT_CLINIC_OR_DEPARTMENT_OTHER): Payer: Self-pay | Admitting: *Deleted

## 2021-12-25 NOTE — Telephone Encounter (Signed)
Post ED Visit - Positive Culture Follow-up  Culture report reviewed by antimicrobial stewardship pharmacist: [place] Team []  Elenor Quinones, Pharm.D. [x]  Heide Guile, Pharm.D., BCPS AQ-ID []  Parks Neptune, Pharm.D., BCPS []  Alycia Rossetti, Pharm.D., BCPS []  [place], Pharm.D., BCPS, AAHIVP []  Legrand Como, Pharm.D., BCPS, AAHIVP []  Salome Arnt, PharmD, BCPS []  Johnnette Gourd, PharmD, BCPS []  Hughes Better, PharmD, BCPS []  Leeroy Cha, PharmD []  Laqueta Linden, PharmD, BCPS []  Albertina Parr, PharmD  [place] Team []  Leodis Sias, PharmD []  Lindell Spar, PharmD []  Royetta Asal, PharmD []  Graylin Shiver, Rph []  Rema Fendt) Glennon Mac, PharmD []  Arlyn Dunning, PharmD []  Netta Cedars, PharmD []  Dia Sitter, PharmD []  Leone Haven, PharmD []  Gretta Arab, PharmD []  Theodis Shove, PharmD []  Peggyann Juba, PharmD []  Reuel Boom, PharmD   Positive urine culture Treated with Cephalexin, organism sensitive to the same and no further patient follow-up is required at this time.  Harlon Flor [facility] 12/25/2021, 9:42 AM

## 2022-01-14 ENCOUNTER — Encounter: Payer: Self-pay | Admitting: Physician Assistant

## 2022-01-14 ENCOUNTER — Ambulatory Visit
Admission: EM | Admit: 2022-01-14 | Discharge: 2022-01-14 | Disposition: A | Discharge status: Home / Self Care | Payer: Commercial Managed Care - PPO | Attending: Physician Assistant | Admitting: Physician Assistant

## 2022-01-14 DIAGNOSIS — Z113 Encounter for screening for infections with a predominantly sexual mode of transmission: Secondary | ICD-10-CM | POA: Diagnosis present

## 2022-01-14 NOTE — ED Triage Notes (Addendum)
Pt presents to [facility] with co of  ex boyfriend saying he has trichomoniasis. Pt reports she has no symptoms. Pt just wants to check. Pt reports he told her last night and she hasn't had sex for 2 weeks with him so she's not sure if even was exposed
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
## 2022-01-16 LAB — CERVICOVAGINAL ANCILLARY ONLY
Bacterial Vaginitis (gardnerella): NEGATIVE
Candida Glabrata: NEGATIVE
Candida Vaginitis: NEGATIVE
Chlamydia: NEGATIVE
Comment: NEGATIVE
Comment: NEGATIVE
Comment: NEGATIVE
Comment: NEGATIVE
Comment: NEGATIVE
Comment: NORMAL
Neisseria Gonorrhea: NEGATIVE
Trichomonas: NEGATIVE

## 2022-02-26 ENCOUNTER — Ambulatory Visit (INDEPENDENT_AMBULATORY_CARE_PROVIDER_SITE_OTHER): Payer: Commercial Managed Care - PPO

## 2022-02-26 DIAGNOSIS — Z3042 Encounter for surveillance of injectable contraceptive: Secondary | ICD-10-CM | POA: Diagnosis not present

## 2022-02-26 NOTE — Progress Notes (Signed)
Pt is in the office for depo injection. Administered in LD and pt tolerated well. Next due Mar 21- April 4 .. Administrations This Visit     medroxyPROGESTERone (DEPO-PROVERA) injection 150 mg     Admin Date 02/26/2022 Action Given Dose 150 mg Route Intramuscular Administered By Hinton Lovely, RN

## 2022-05-21 ENCOUNTER — Ambulatory Visit (INDEPENDENT_AMBULATORY_CARE_PROVIDER_SITE_OTHER): Payer: Commercial Managed Care - PPO

## 2022-05-21 DIAGNOSIS — Z3042 Encounter for surveillance of injectable contraceptive: Secondary | ICD-10-CM | POA: Diagnosis not present

## 2022-05-21 MED ORDER — MEDROXYPROGESTERONE ACETATE 150 MG/ML IM SUSY
150.0000 mg | PREFILLED_SYRINGE | Freq: Once | INTRAMUSCULAR | Status: AC
  Administered 2022-05-21: 150 mg via INTRAMUSCULAR

## 2022-05-21 NOTE — Progress Notes (Signed)
Date last pap: 02/22/20. Last Depo-Provera: 02/26/22. Side Effects if any: N/A. Serum HCG indicated? N/A. Depo-Provera 150 mg IM given by: Donaciano Eva., RN. Injection given in RD. Patient tolerated well.Marland Kitchen Next appointment due: 6/12/-6/26.

## 2022-06-30 ENCOUNTER — Ambulatory Visit: Payer: Commercial Managed Care - PPO | Admitting: Obstetrics and Gynecology

## 2022-08-14 ENCOUNTER — Ambulatory Visit: Payer: Commercial Managed Care - PPO

## 2022-08-18 ENCOUNTER — Other Ambulatory Visit: Payer: Self-pay

## 2022-08-18 DIAGNOSIS — Z3042 Encounter for surveillance of injectable contraceptive: Secondary | ICD-10-CM

## 2022-08-18 MED ORDER — MEDROXYPROGESTERONE ACETATE 150 MG/ML IM SUSP: 150.0000 mg | INTRAMUSCULAR | 0 refills | Status: DC

## 2022-08-19 ENCOUNTER — Ambulatory Visit (INDEPENDENT_AMBULATORY_CARE_PROVIDER_SITE_OTHER): Payer: Commercial Managed Care - PPO

## 2022-08-19 DIAGNOSIS — Z3042 Encounter for surveillance of injectable contraceptive: Secondary | ICD-10-CM | POA: Diagnosis not present

## 2022-08-19 NOTE — Progress Notes (Signed)
Pt is in the office for depo injection. Administered in LD and pt tolerated well. Next due Sept 10-24. .. Administrations This Visit     medroxyPROGESTERone (DEPO-PROVERA) injection 150 mg     Admin Date 08/19/2022 Action Given Dose 150 mg Route Intramuscular Administered By Katrina Stack, RN

## 2022-10-14 IMAGING — US US BREAST*R* LIMITED INC AXILLA
1 series · 3 of 3 positions shown · non-contrast
Comparison: None Available.

CLINICAL DATA: Patient describes intermittent pain within the lower
outer quadrant of the RIGHT breast for approximately 2 years.
Patient's mother was diagnosed with breast cancer at age 41.

EXAM:
ULTRASOUND OF THE RIGHT BREAST

[Series 1: us breast*right* limited inc axilla · 0.06mm/px · 3 of 3 slices shown]
[im 1/3]
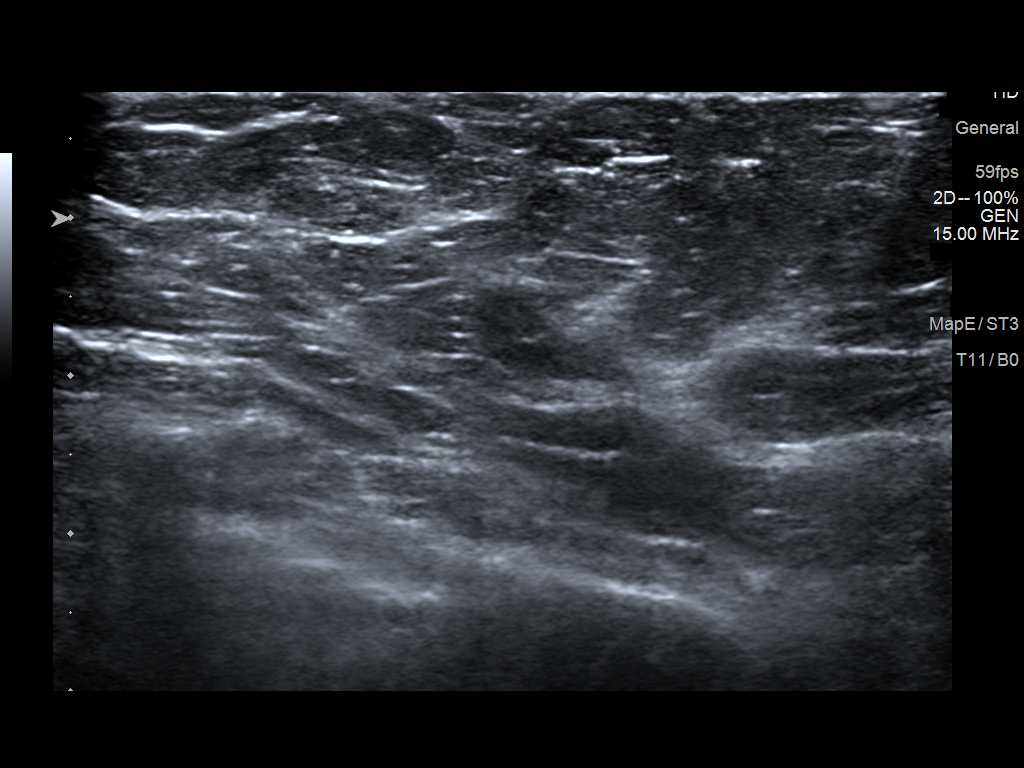
[im 2/3]
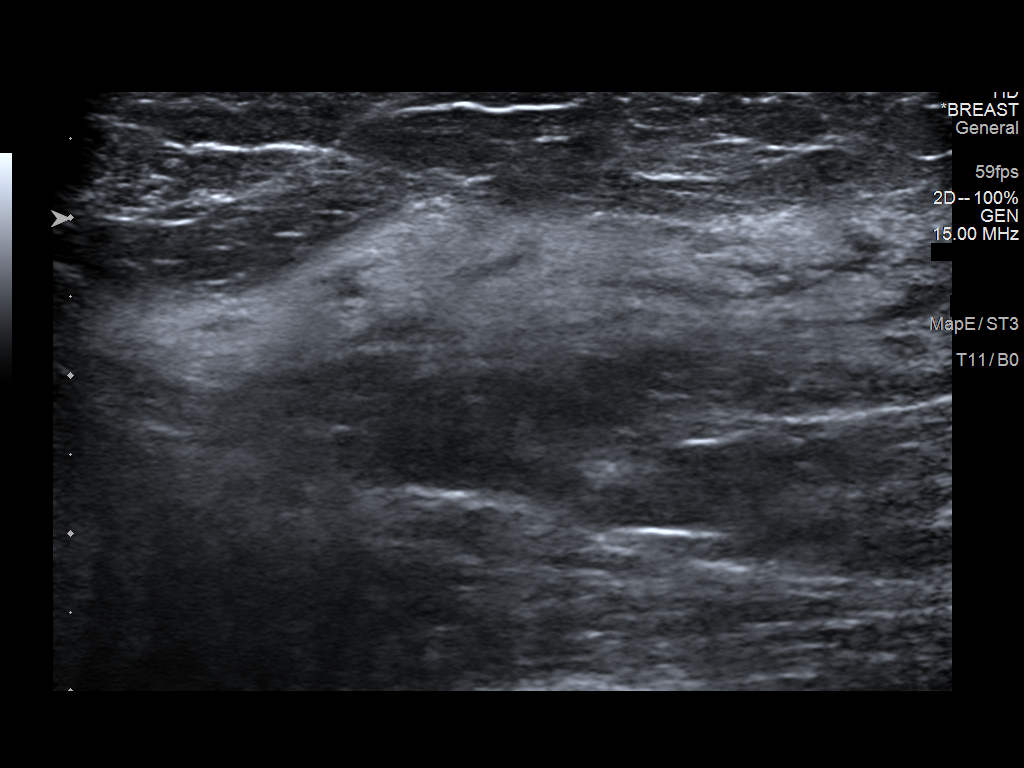
[im 3/3]
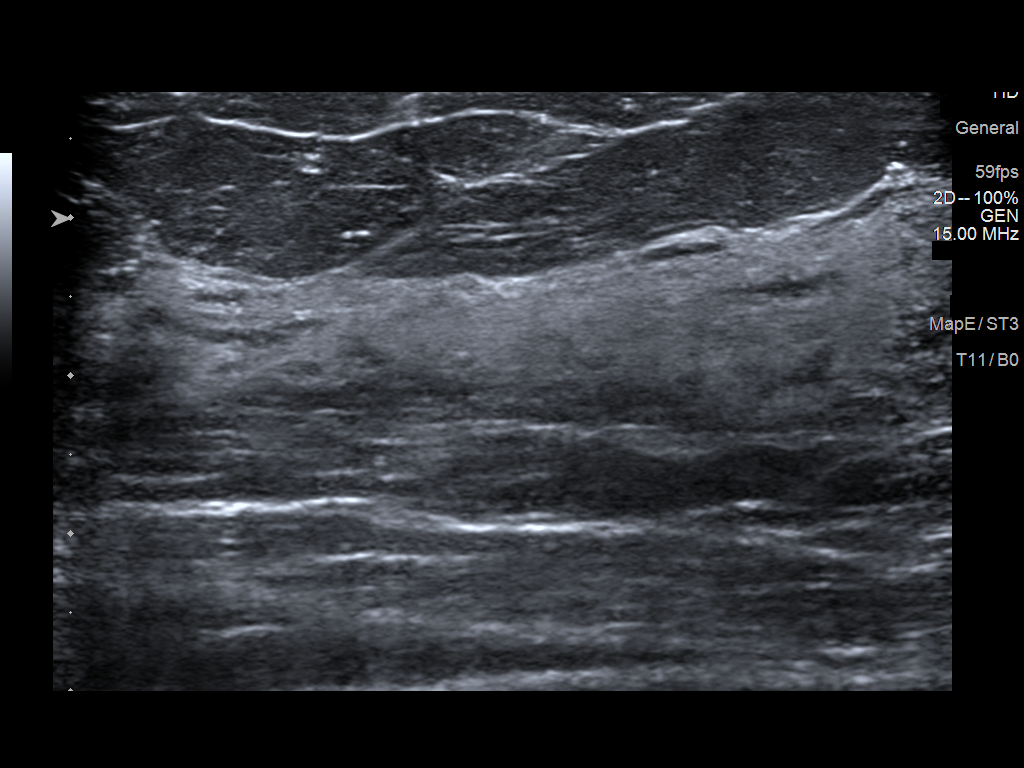

[3 of 3 positions shown; findings below may reference images not displayed]

FINDINGS: Targeted ultrasound is performed, evaluating the lower outer
quadrant of the RIGHT breast as directed by the patient, showing
only normal fibroglandular tissues and fat lobules throughout. No
solid or cystic mass.
IMPRESSION: No evidence of malignancy within the lower outer quadrant of the
RIGHT breast, corresponding to the area of pain that patient
describes.

RECOMMENDATION:
1. Recommend genetics consultation. Per the patient, this is already
in progress. If patient was found to have a genetic predisposition
to breast cancer, would then recommend starting breast MRIs at age
27 and screening mammogram starting at age 30. If patient is not
found to have a genetic predisposition cancer, recommend starting
screening mammograms at age 33 (10 years prior to the age of the
patient's mother at diagnosis).
2. Breast pain is a common condition which will often resolve on its
own without intervention. Benign causes of breast pain were
discussed with the patient. The patient was instructed to return for
additional imaging if a palpable lump developed or if the pain
became focal and persistent.

I have discussed the findings and recommendations with the patient.
If applicable, a reminder letter will be sent to the patient
regarding the next appointment.

BI-RADS CATEGORY  1: Negative.

## 2022-10-20 ENCOUNTER — Encounter: Payer: Self-pay | Admitting: Obstetrics and Gynecology

## 2022-10-20 ENCOUNTER — Other Ambulatory Visit (HOSPITAL_COMMUNITY)
Admission: RE | Admit: 2022-10-20 | Discharge: 2022-10-20 | Disposition: A | Discharge status: Home / Self Care | Payer: Commercial Managed Care - PPO | Source: Ambulatory Visit | Attending: Obstetrics and Gynecology | Admitting: Obstetrics and Gynecology

## 2022-10-20 ENCOUNTER — Ambulatory Visit (INDEPENDENT_AMBULATORY_CARE_PROVIDER_SITE_OTHER): Payer: Commercial Managed Care - PPO | Admitting: Obstetrics and Gynecology

## 2022-10-20 VITALS — BP 118/75 | HR 91 | Ht 61.0 in | Wt 166.0 lb

## 2022-10-20 DIAGNOSIS — Z01419 Encounter for gynecological examination (general) (routine) without abnormal findings: Secondary | ICD-10-CM

## 2022-10-20 DIAGNOSIS — Z3042 Encounter for surveillance of injectable contraceptive: Secondary | ICD-10-CM

## 2022-10-20 DIAGNOSIS — B9689 Other specified bacterial agents as the cause of diseases classified elsewhere: Secondary | ICD-10-CM | POA: Diagnosis not present

## 2022-10-20 DIAGNOSIS — Z113 Encounter for screening for infections with a predominantly sexual mode of transmission: Secondary | ICD-10-CM | POA: Insufficient documentation

## 2022-10-20 DIAGNOSIS — Z1151 Encounter for screening for human papillomavirus (HPV): Secondary | ICD-10-CM | POA: Diagnosis not present

## 2022-10-20 DIAGNOSIS — Z8481 Family history of carrier of genetic disease: Secondary | ICD-10-CM | POA: Diagnosis not present

## 2022-10-20 DIAGNOSIS — N760 Acute vaginitis: Secondary | ICD-10-CM | POA: Diagnosis not present

## 2022-10-20 MED ORDER — MEDROXYPROGESTERONE ACETATE 150 MG/ML IM SUSP: 150.0000 mg | INTRAMUSCULAR | 3 refills | Status: AC

## 2022-10-20 NOTE — Progress Notes (Signed)
Subjective:     Kayla Ochoa is a 27 y.o. female P0 with amenorrhea secondary to depo-provera and BMI 31 who is here for a comprehensive physical exam. The patient reports vaginitis occasionally with concerns for possible BV. She is not currently sexually active. She denies pelvic pain or abnormal discharge. She admits to a vaginal odor. She is without any other complaints.  Past Medical History:  Diagnosis Date   Anxiety    Asthma    Depression    HSV infection    Juvenile rheumatoid arthritis (HCC)    Uveitis    Past Surgical History:  Procedure Laterality Date   TYMPANOSTOMY TUBE PLACEMENT Bilateral 2001   WISDOM TOOTH EXTRACTION Bilateral    Family History  Problem Relation Age of Onset   Cancer Mother    Schizophrenia Maternal Grandmother    Diabetes Maternal Grandfather    Breast cancer Maternal Aunt    Diabetes Paternal Aunt     Social History   Socioeconomic History   Marital status: Single    Spouse name: Not on file   Number of children: Not on file   Years of education: Not on file   Highest education level: Not on file  Occupational History   Not on file  Tobacco Use   Smoking status: Former    Types: Cigars   Smokeless tobacco: Never  Vaping Use   Vaping status: Never Used  Substance and Sexual Activity   Alcohol use: No   Drug use: Not Currently    Types: Other-see comments    Comment: CBD   Sexual activity: Yes    Partners: Male    Birth control/protection: Injection    Comment: depo  Other Topics Concern   Not on file  Social History Narrative   Not on file   Social Determinants of Health   Financial Resource Strain: Not on file  Food Insecurity: Not on file  Transportation Needs: Not on file  Physical Activity: Not on file  Stress: Not on file  Social Connections: Not on file  Intimate Partner Violence: Not on file   Health Maintenance  Topic Date Due   DTaP/Tdap/Td (2 - Td or Tdap) 06/07/2019   COVID-19 Vaccine (3 - 2023-24  season) 10/25/2021   INFLUENZA VACCINE  09/25/2022   PAP-Cervical Cytology Screening  02/22/2023   PAP SMEAR-Modifier  02/22/2023   HPV VACCINES  Completed   Hepatitis C Screening  Completed   HIV Screening  Completed       Review of Systems Pertinent items noted in HPI and remainder of comprehensive ROS otherwise negative.   Objective:    Blood pressure 118/75, pulse 91, height 5\' 1"  (1.549 m), weight 166 lb (75.3 kg).  GENERAL: Well-developed, well-nourished female in no acute distress.  HEENT: Normocephalic, atraumatic. Sclerae anicteric.  NECK: Supple. Normal thyroid.  LUNGS: Clear to auscultation bilaterally.  HEART: Regular rate and rhythm. BREASTS: Symmetric in size. No palpable masses or lymphadenopathy, skin changes, or nipple drainage. ABDOMEN: Soft, nontender, nondistended. No organomegaly. PELVIC: Normal external female genitalia. Vagina is pink and rugated.  Normal discharge. Normal appearing cervix. Uterus is normal in size. No adnexal mass or tenderness. Chaperone present during the pelvic exam EXTREMITIES: No cyanosis, clubbing, or edema, 2+ distal pulses.     Assessment:    Healthy female exam.      Plan:    Pap smear collected Patient with genetic predisposition to breast cancer- Breast MRI ordered per radiologist Patient will be contacted with abnormal  result STI screening per patient request See After Visit Summary for Counseling Recommendations

## 2022-10-21 LAB — HEPATITIS B SURFACE ANTIGEN: Hepatitis B Surface Ag: NEGATIVE

## 2022-10-21 LAB — HEPATITIS C ANTIBODY: Hep C Virus Ab: NONREACTIVE

## 2022-10-21 LAB — RPR: RPR Ser Ql: NONREACTIVE

## 2022-10-21 LAB — HIV ANTIBODY (ROUTINE TESTING W REFLEX): HIV Screen 4th Generation wRfx: NONREACTIVE

## 2022-10-22 LAB — CERVICOVAGINAL ANCILLARY ONLY
Bacterial Vaginitis (gardnerella): POSITIVE — AB
Candida Glabrata: NEGATIVE
Candida Vaginitis: NEGATIVE
Chlamydia: NEGATIVE
Comment: NEGATIVE
Comment: NEGATIVE
Comment: NEGATIVE
Comment: NEGATIVE
Comment: NEGATIVE
Comment: NORMAL
Neisseria Gonorrhea: NEGATIVE
Trichomonas: NEGATIVE

## 2022-10-22 MED ORDER — METRONIDAZOLE 500 MG PO TABS: 500.0000 mg | ORAL_TABLET | Freq: Two times a day (BID) | ORAL | 0 refills | Status: AC

## 2022-10-22 NOTE — Addendum Note (Signed)
Addended by: Catalina Antigua on: 10/22/2022 12:03 PM   Modules accepted: Orders

## 2022-10-30 LAB — CYTOLOGY - PAP
Adequacy: ABSENT
Comment: NEGATIVE
Diagnosis: NEGATIVE
High risk HPV: NEGATIVE

## 2022-11-04 ENCOUNTER — Ambulatory Visit: Payer: Commercial Managed Care - PPO

## 2022-11-05 ENCOUNTER — Ambulatory Visit: Payer: Commercial Managed Care - PPO

## 2022-11-17 ENCOUNTER — Ambulatory Visit: Payer: Commercial Managed Care - PPO

## 2022-11-19 ENCOUNTER — Ambulatory Visit: Payer: Commercial Managed Care - PPO

## 2022-11-24 ENCOUNTER — Ambulatory Visit (HOSPITAL_COMMUNITY)
Admission: EM | Admit: 2022-11-24 | Discharge: 2022-11-24 | Disposition: A | Discharge status: Home / Self Care | Payer: Commercial Managed Care - PPO

## 2022-11-24 ENCOUNTER — Telehealth (HOSPITAL_COMMUNITY): Payer: Self-pay | Admitting: Professional

## 2022-11-24 DIAGNOSIS — Z638 Other specified problems related to primary support group: Secondary | ICD-10-CM | POA: Diagnosis not present
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
## 2022-11-24 NOTE — Discharge Instructions (Signed)
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
## 2022-12-22 ENCOUNTER — Other Ambulatory Visit (HOSPITAL_COMMUNITY): Payer: Commercial Managed Care - PPO

## 2023-02-01 ENCOUNTER — Emergency Department (HOSPITAL_COMMUNITY)
Admission: EM | Admit: 2023-02-01 | Discharge: 2023-02-01 | Disposition: A | Discharge status: Home / Self Care | Payer: Commercial Managed Care - PPO | Attending: Emergency Medicine | Admitting: Emergency Medicine

## 2023-02-01 ENCOUNTER — Other Ambulatory Visit: Payer: Self-pay

## 2023-02-01 ENCOUNTER — Encounter (HOSPITAL_COMMUNITY): Payer: Self-pay

## 2023-02-01 DIAGNOSIS — R519 Headache, unspecified: Secondary | ICD-10-CM | POA: Diagnosis present

## 2023-02-01 DIAGNOSIS — Y92410 Unspecified street and highway as the place of occurrence of the external cause: Secondary | ICD-10-CM | POA: Insufficient documentation

## 2023-02-01 MED ORDER — KETOROLAC TROMETHAMINE 15 MG/ML IJ SOLN
15.0000 mg | Freq: Once | INTRAMUSCULAR | Status: AC
  Administered 2023-02-01: 15 mg via INTRAMUSCULAR
  Filled 2023-02-01: qty 1

## 2023-02-01 MED ORDER — NAPROXEN 500 MG PO TABS: 500.0000 mg | ORAL_TABLET | Freq: Two times a day (BID) | ORAL | 0 refills | Status: AC

## 2023-02-01 MED ORDER — CYCLOBENZAPRINE HCL 10 MG PO TABS: 10.0000 mg | ORAL_TABLET | Freq: Two times a day (BID) | ORAL | 0 refills | Status: AC | PRN
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
## 2023-02-23 ENCOUNTER — Ambulatory Visit
Admission: EM | Admit: 2023-02-23 | Discharge: 2023-02-23 | Disposition: A | Discharge status: Home / Self Care | Payer: Commercial Managed Care - PPO | Attending: Family Medicine | Admitting: Family Medicine

## 2023-02-23 DIAGNOSIS — B349 Viral infection, unspecified: Secondary | ICD-10-CM

## 2023-02-23 DIAGNOSIS — R112 Nausea with vomiting, unspecified: Secondary | ICD-10-CM

## 2023-02-23 DIAGNOSIS — U071 COVID-19: Secondary | ICD-10-CM

## 2023-02-23 LAB — POC COVID19/FLU A&B COMBO
Covid Antigen, POC: POSITIVE — AB
Influenza A Antigen, POC: NEGATIVE
Influenza B Antigen, POC: NEGATIVE

## 2023-02-23 MED ORDER — PROMETHAZINE-DM 6.25-15 MG/5ML PO SYRP: 5.0000 mL | ORAL_SOLUTION | Freq: Three times a day (TID) | ORAL | 0 refills | Status: AC | PRN

## 2023-02-23 MED ORDER — CETIRIZINE HCL 10 MG PO TABS: 10.0000 mg | ORAL_TABLET | Freq: Every day | ORAL | 0 refills | Status: AC

## 2023-02-23 MED ORDER — ONDANSETRON 8 MG PO TBDP: 8.0000 mg | ORAL_TABLET | Freq: Three times a day (TID) | ORAL | 0 refills | Status: AC | PRN

## 2023-02-23 MED ORDER — ALBUTEROL SULFATE HFA 108 (90 BASE) MCG/ACT IN AERS: 1.0000 | INHALATION_SPRAY | RESPIRATORY_TRACT | 0 refills | Status: AC | PRN

## 2023-02-23 NOTE — Discharge Instructions (Addendum)
We will manage this as a viral illness. For sore throat or cough try using a honey-based tea. Use 3 teaspoons of honey with juice squeezed from half lemon. Place shaved pieces of ginger into 1/2-1 cup of water and warm over stove top. Then mix the ingredients and repeat every 4 hours as needed. Please take ibuprofen 600mg  every 6 hours with food alternating with OR taken together with Tylenol 500mg -650mg  every 6 hours for throat pain, fevers, aches and pains. Hydrate very well with at least 2 liters of water. Eat light meals such as soups (chicken and noodles, vegetable, chicken and wild rice).  Do not eat foods that you are allergic to.  Taking an antihistamine like Zyrtec (10mg  daily) can help against postnasal drainage, sinus congestion which can cause sinus pain, sinus headaches, throat pain, painful swallowing, coughing.  You can take this together with cough syrup as needed.

## 2023-02-23 NOTE — ED Provider Notes (Signed)
[facility] - URGENT [facility]ARE C[facility]  Note:  This document was prepared using Conservation officer, historic buildings and may include unintentional dictation errors.  MRN: [id number] DOB: Jul 06, 1995  Subjective:   Kayla Ochoa is a 27 y.o. female presenting for 2-day history of malaise, fatigue, coughing, 1 episode of vomiting this morning.  Would like to COVID flu test.  No chest pain, shortness of breath or wheezing.  Has a remote history of asthma, has not had difficulties with this in years. No smoking of any kind including cigarettes, cigars, vaping, marijuana use.     Current Facility-Administered Medications:    medroxyPROGESTERone (DEPO-PROVERA) injection 150 mg, 150 mg, Intramuscular, Q90 days, Adam Phenix, MD, 150 mg at 08/19/22 1132  Current Outpatient Medications:    albuterol (VENTOLIN HFA) 108 (90 Base) MCG/ACT inhaler, Inhale 1-2 puffs into the lungs every 4 (four) hours as needed for wheezing or shortness of breath., Disp: 1 each, Rfl: 0   busPIRone (BUSPAR) 7.5 MG tablet, Take 7.5 mg by mouth once. (Patient not taking: Reported on 09/11/2021), Disp: , Rfl:    cyclobenzaprine (FLEXERIL) 10 MG tablet, Take 1 tablet (10 mg total) by mouth 2 (two) times daily as needed for muscle spasms., Disp: 20 tablet, Rfl: 0   escitalopram (LEXAPRO) 10 MG tablet, Take 20 mg by mouth daily., Disp: , Rfl:    hydrOXYzine (ATARAX) 25 MG tablet, Take 25 mg by mouth at bedtime., Disp: , Rfl:    ibuprofen (ADVIL) 800 MG tablet, Take 1 tablet (800 mg total) by mouth 3 (three) times daily. (Patient not taking: Reported on 08/19/2022), Disp: 21 tablet, Rfl: 0   lamoTRIgine (LAMICTAL) 100 MG tablet, Take 100 mg by mouth daily., Disp: , Rfl:    medroxyPROGESTERone (DEPO-PROVERA) 150 MG/ML injection, Inject 1 mL (150 mg total) into the muscle every 3 (three) months., Disp: 1 mL, Rfl: 3   metroNIDAZOLE (FLAGYL) 500 MG tablet, Take 1 tablet (500 mg total) by mouth 2 (two) times daily., Disp: 14 tablet, Rfl:  0   naproxen (NAPROSYN) 500 MG tablet, Take 1 tablet (500 mg total) by mouth 2 (two) times daily., Disp: 30 tablet, Rfl: 0   omeprazole (PRILOSEC) 40 MG capsule, Take 40 mg by mouth daily., Disp: , Rfl:    prazosin (MINIPRESS) 1 MG capsule, Take 1 mg by mouth at bedtime., Disp: , Rfl:    Allergies  Allergen Reactions   Iodine Anaphylaxis   Fish Allergy Diarrhea and Swelling    Swelling to face. Tested positive   Other Swelling    Tree nuts Pecans strawberries   Shrimp [Shellfish Allergy] Swelling    Past Medical History:  Diagnosis Date   Anxiety    Asthma    Depression    HSV infection    Juvenile rheumatoid arthritis (HCC)    Uveitis      Past Surgical History:  Procedure Laterality Date   TYMPANOSTOMY TUBE PLACEMENT Bilateral 2001   WISDOM TOOTH EXTRACTION Bilateral     Family History  Problem Relation Age of Onset   Cancer Mother    Schizophrenia Maternal Grandmother    Diabetes Maternal Grandfather    Breast cancer Maternal Aunt    Diabetes Paternal Aunt     Social History   Tobacco Use   Smoking status: Former    Types: Cigars   Smokeless tobacco: Never  Vaping Use   Vaping status: Never Used  Substance Use Topics   Alcohol use: No   Drug use: Not Currently  ROS   Objective:   Vitals: BP 123/78 (BP Location: Right Arm)   Pulse (!) 112   Temp 99.5 F (37.5 C) (Oral)   Resp 20   SpO2 98%   Physical Exam Constitutional:      General: She is not in acute distress.    Appearance: Normal appearance. She is well-developed. She is not ill-appearing, toxic-appearing or diaphoretic.  HENT:     Head: Normocephalic and atraumatic.     Nose: Nose normal.     Mouth/Throat:     Mouth: Mucous membranes are moist.     Pharynx: No pharyngeal swelling, oropharyngeal exudate, posterior oropharyngeal erythema or uvula swelling.     Tonsils: No tonsillar exudate or tonsillar abscesses. 0 on the right. 0 on the left.  Eyes:     General: No scleral  icterus.       Right eye: No discharge.        Left eye: No discharge.     Extraocular Movements: Extraocular movements intact.     Conjunctiva/sclera: Conjunctivae normal.  Cardiovascular:     Rate and Rhythm: Normal rate and regular rhythm.     Heart sounds: Normal heart sounds. No murmur heard.    No friction rub. No gallop.  Pulmonary:     Effort: Pulmonary effort is normal. No respiratory distress.     Breath sounds: No stridor. No wheezing, rhonchi or rales.  Chest:     Chest wall: No tenderness.  Abdominal:     General: Bowel sounds are normal. There is no distension.     Palpations: Abdomen is soft. There is no mass.     Tenderness: There is no abdominal tenderness. There is no right CVA tenderness, left CVA tenderness, guarding or rebound.  Skin:    General: Skin is warm and dry.  Neurological:     General: No focal deficit present.     Mental Status: She is alert and oriented to person, place, and time.  Psychiatric:        Mood and Affect: Mood normal.        Behavior: Behavior normal.        Thought Content: Thought content normal.        Judgment: Judgment normal.     Results for orders placed or performed during the hospital encounter of 02/23/23 (from the past 24 hours)  POC Covid + Flu A/B Antigen     Status: Abnormal   Collection Time: 02/23/23 10:14 AM  Result Value Ref Range   Influenza A Antigen, POC Negative Negative   Influenza B Antigen, POC Negative Negative   Covid Antigen, POC Positive (A) Negative    Assessment and Plan :   PDMP not reviewed this encounter.  1. COVID-19   2. Nausea and vomiting, unspecified vomiting type    Deferred imaging given clear cardiopulmonary exam, hemodynamically stable vital signs. Will manage COVID-19 with supportive care. Counseled patient on nature of COVID-19 including modes of transmission, diagnostic testing, management and supportive care.  Offered symptomatic relief. Counseled patient on potential for  adverse effects with medications prescribed/recommended today, strict ER and return-to-clinic precautions discussed, patient verbalized understanding.      Wallis Bamberg, [place] 02/23/23 1438

## 2023-02-23 NOTE — ED Triage Notes (Signed)
Pt c/o vomited x 1 this am-feeling "hot cold"/unknown fever-c/o HA started last night-last dose ibuprofen 7am-NAD-steady gait

## 2023-02-26 ENCOUNTER — Ambulatory Visit
Admission: EM | Admit: 2023-02-26 | Discharge: 2023-02-26 | Disposition: A | Discharge status: Home / Self Care | Payer: Commercial Managed Care - PPO | Attending: Family Medicine | Admitting: Family Medicine

## 2023-02-26 DIAGNOSIS — U071 COVID-19: Secondary | ICD-10-CM | POA: Insufficient documentation

## 2023-02-26 NOTE — ED Triage Notes (Signed)
 Pt presents to UC for covid test. Pt states her job needs her to be retested for covid since she tested positive x4 days ago. Has a sore throat. Also needs a work note.

## 2023-02-26 NOTE — Discharge Instructions (Signed)
 The clinic will contact you if your COVID testing is positive.  Continue previous medications as prescribed.  Follow-up with your PCP if your symptoms do not improve.  Please go to the ER for any worsening symptoms.  I hope you feel better soon!
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
## 2023-02-27 LAB — SARS CORONAVIRUS 2 (TAT 6-24 HRS): SARS Coronavirus 2: POSITIVE — AB

## 2023-06-14 NOTE — Progress Notes (Deleted)
 Office Visit Note  Patient: Kayla Ochoa             Date of Birth: 10-Oct-1995           MRN: [id number]             PCP: Danella Dunn, MD Referring: Danella Dunn, MD Visit Date: 06/16/2023 Occupation: @GUAROCC @  Subjective:  No chief complaint on file.   History of Present Illness: Kayla Ochoa is a 28 y.o. female ***     Activities of Daily Living:  Patient reports morning stiffness for *** {minute/hour:19697}.   Patient {ACTIONS;DENIES/REPORTS:21021675::"Denies"} nocturnal pain.  Difficulty dressing/grooming: {ACTIONS;DENIES/REPORTS:21021675::"Denies"} Difficulty climbing stairs: {ACTIONS;DENIES/REPORTS:21021675::"Denies"} Difficulty getting out of chair: {ACTIONS;DENIES/REPORTS:21021675::"Denies"} Difficulty using hands for taps, buttons, cutlery, and/or writing: {ACTIONS;DENIES/REPORTS:21021675::"Denies"}  No Rheumatology ROS completed.   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    Past Medical History:  Diagnosis Date   Anxiety    Asthma    Depression    HSV infection    Juvenile rheumatoid arthritis (HCC)    Uveitis     Family History  Problem Relation Age of Onset   Cancer Mother    Schizophrenia Maternal Grandmother    Diabetes Maternal Grandfather    Breast cancer Maternal Aunt    Diabetes Paternal Aunt    Past Surgical History:  Procedure Laterality Date   TYMPANOSTOMY TUBE PLACEMENT Bilateral 2001   WISDOM TOOTH EXTRACTION Bilateral    Social History   Social  History Narrative   Not on file   Immunization History  Administered Date(s) Administered   HPV 9-valent 02/22/2020   Hpv-Unspecified 02/05/2012, 08/08/2014   Influenza,inj,Quad PF,6+ Mos 01/09/2017   PFIZER(Purple Top)SARS-COV-2 Vaccination 10/26/2019, 11/30/2019   Tdap 06/06/2009     Objective: Vital Signs: There were no vitals taken for this visit.   Physical Exam   Musculoskeletal Exam: ***  CDAI Exam: CDAI Score: -- Patient Global: --; Provider Global: -- Swollen: --; Tender: -- Joint Exam 06/16/2023   No joint exam has been documented for this visit   There is currently no information documented on the homunculus. Go to the Rheumatology activity and complete the homunculus joint exam.  Investigation: No additional findings.  Imaging: No results found.  Recent Labs: Lab Results  Component Value Date   WBC 7.7 01/03/2021   HGB 12.6 01/03/2021   PLT 296 01/03/2021   NA 139 01/03/2021   K 3.7 01/03/2021   CL 106 01/03/2021   CO2 24 01/03/2021   GLUCOSE 97 01/03/2021   BUN 8 01/03/2021   CREATININE 0.69 01/03/2021   BILITOT 0.5 01/22/2019   ALKPHOS 44 01/22/2019   AST 31 01/22/2019   ALT 55 (H) 01/22/2019   PROT 7.1 01/22/2019   ALBUMIN 4.0 01/22/2019   CALCIUM 10.0 01/03/2021   GFRAA >60 08/17/2019    Speciality Comments: No specialty comments available.  Procedures:  No procedures performed Allergies: Iodine, Fish allergy, Other, and Shrimp [shellfish allergy]   Assessment / Plan:     Visit Diagnoses: No diagnosis found.  Orders: No orders of the defined types  were placed in this encounter.  No orders of the defined types were placed in this encounter.   Face-to-face time spent with patient was *** minutes. Greater than 50% of time was spent in counseling and coordination of care.  Follow-Up Instructions: No follow-ups on file.   Matt Song, MD  Note - This record has been created using [employer].  Chart creation errors  have been sought, but may not always  have been located. Such creation errors do not reflect on  the standard of medical care.

## 2023-06-16 ENCOUNTER — Encounter: Payer: Commercial Managed Care - PPO | Admitting: Internal Medicine
